# Patient Record
Sex: Female | Born: 1965 | Race: Black or African American | Hispanic: No | Marital: Married | Smoking: Never smoker
Health system: Southern US, Community
[De-identification: ages and names within clinical notes are randomized; demographics above are authoritative.]

## PROBLEM LIST (undated history)

## (undated) DIAGNOSIS — C50919 Malignant neoplasm of unspecified site of unspecified female breast: Secondary | ICD-10-CM

## (undated) DIAGNOSIS — I10 Essential (primary) hypertension: Secondary | ICD-10-CM

## (undated) HISTORY — PX: UTERINE FIBROID EMBOLIZATION: SHX825

## (undated) HISTORY — DX: Malignant neoplasm of unspecified site of unspecified female breast: C50.919

## (undated) HISTORY — PX: BREAST BIOPSY: SHX20

---

## 1983-10-02 HISTORY — PX: HAND TENDON SURGERY: SHX663

## 2014-12-14 ENCOUNTER — Encounter (HOSPITAL_COMMUNITY): Payer: Self-pay | Admitting: Emergency Medicine

## 2014-12-14 ENCOUNTER — Emergency Department (HOSPITAL_COMMUNITY)
Admission: EM | Admit: 2014-12-14 | Discharge: 2014-12-14 | Disposition: A | Discharge status: Home / Self Care | Payer: Self-pay | Source: Home / Self Care | Attending: Family Medicine | Admitting: Family Medicine

## 2014-12-14 DIAGNOSIS — R05 Cough: Secondary | ICD-10-CM

## 2014-12-14 DIAGNOSIS — R059 Cough, unspecified: Secondary | ICD-10-CM

## 2014-12-14 DIAGNOSIS — J069 Acute upper respiratory infection, unspecified: Secondary | ICD-10-CM

## 2014-12-14 DIAGNOSIS — R0982 Postnasal drip: Secondary | ICD-10-CM

## 2014-12-14 MED ORDER — IPRATROPIUM BROMIDE 0.06 % NA SOLN: 2.0000 | Freq: Four times a day (QID) | NASAL | Status: DC

## 2014-12-14 MED ORDER — GUAIFENESIN-CODEINE 100-10 MG/5ML PO SYRP: 5.0000 mL | ORAL_SOLUTION | ORAL | Status: DC | PRN

## 2014-12-14 NOTE — ED Notes (Signed)
C/o  Fatigue.  Body aches.   Chills.  Fever.  S/t.  Nonproductive cough.  Vomiting.  Chest cong.   Rattling in chest at night.   States "symptoms worse at night".   Mild relief with otc meds.

## 2014-12-14 NOTE — Discharge Instructions (Signed)
Upper Respiratory Infection, Adult Use the prescription Cheratussin for cough For drainage recommend Chlor-Trimeton (chlorpheniramine) 2 mg every 4-6 hours    Use lots of nasal saline spray frequently. Use the ipratropium nasal spray to help with nasal drainage. Sudafed PE 10 mg every 4 hours for head congestion. Drink lots of fluids and stay well-hydrated  An upper respiratory infection (URI) is also sometimes known as the common cold. The upper respiratory tract includes the nose, sinuses, throat, trachea, and bronchi. Bronchi are the airways leading to the lungs. Most people improve within 1 week, but symptoms can last up to 2 weeks. A residual cough may last even longer.  CAUSES Many different viruses can infect the tissues lining the upper respiratory tract. The tissues become irritated and inflamed and often become very moist. Mucus production is also common. A cold is contagious. You can easily spread the virus to others by oral contact. This includes kissing, sharing a glass, coughing, or sneezing. Touching your mouth or nose and then touching a surface, which is then touched by another person, can also spread the virus. SYMPTOMS  Symptoms typically develop 1 to 3 days after you come in contact with a cold virus. Symptoms vary from person to person. They may include:  Runny nose.  Sneezing.  Nasal congestion.  Sinus irritation.  Sore throat.  Loss of voice (laryngitis).  Cough.  Fatigue.  Muscle aches.  Loss of appetite.  Headache.  Low-grade fever. DIAGNOSIS  You might diagnose your own cold based on familiar symptoms, since most people get a cold 2 to 3 times a year. Your caregiver can confirm this based on your exam. Most importantly, your caregiver can check that your symptoms are not due to another disease such as strep throat, sinusitis, pneumonia, asthma, or epiglottitis. Blood tests, throat tests, and X-rays are not necessary to diagnose a common cold, but they  may sometimes be helpful in excluding other more serious diseases. Your caregiver will decide if any further tests are required. RISKS AND COMPLICATIONS  You may be at risk for a more severe case of the common cold if you smoke cigarettes, have chronic heart disease (such as heart failure) or lung disease (such as asthma), or if you have a weakened immune system. The very young and very old are also at risk for more serious infections. Bacterial sinusitis, middle ear infections, and bacterial pneumonia can complicate the common cold. The common cold can worsen asthma and chronic obstructive pulmonary disease (COPD). Sometimes, these complications can require emergency medical care and may be life-threatening. PREVENTION  The best way to protect against getting a cold is to practice good hygiene. Avoid oral or hand contact with people with cold symptoms. Wash your hands often if contact occurs. There is no clear evidence that vitamin C, vitamin E, echinacea, or exercise reduces the chance of developing a cold. However, it is always recommended to get plenty of rest and practice good nutrition. TREATMENT  Treatment is directed at relieving symptoms. There is no cure. Antibiotics are not effective, because the infection is caused by a virus, not by bacteria. Treatment may include:  Increased fluid intake. Sports drinks offer valuable electrolytes, sugars, and fluids.  Breathing heated mist or steam (vaporizer or shower).  Eating chicken soup or other clear broths, and maintaining good nutrition.  Getting plenty of rest.  Using gargles or lozenges for comfort.  Controlling fevers with ibuprofen or acetaminophen as directed by your caregiver.  Increasing usage of your inhaler if  you have asthma. Zinc gel and zinc lozenges, taken in the first 24 hours of the common cold, can shorten the duration and lessen the severity of symptoms. Pain medicines may help with fever, muscle aches, and throat pain. A  variety of non-prescription medicines are available to treat congestion and runny nose. Your caregiver can make recommendations and may suggest nasal or lung inhalers for other symptoms.  HOME CARE INSTRUCTIONS   Only take over-the-counter or prescription medicines for pain, discomfort, or fever as directed by your caregiver.  Use a warm mist humidifier or inhale steam from a shower to increase air moisture. This may keep secretions moist and make it easier to breathe.  Drink enough water and fluids to keep your urine clear or pale yellow.  Rest as needed.  Return to work when your temperature has returned to normal or as your caregiver advises. You may need to stay home longer to avoid infecting others. You can also use a face mask and careful hand washing to prevent spread of the virus. SEEK MEDICAL CARE IF:   After the first few days, you feel you are getting worse rather than better.  You need your caregiver's advice about medicines to control symptoms.  You develop chills, worsening shortness of breath, or brown or red sputum. These may be signs of pneumonia.  You develop yellow or brown nasal discharge or pain in the face, especially when you bend forward. These may be signs of sinusitis.  You develop a fever, swollen neck glands, pain with swallowing, or white areas in the back of your throat. These may be signs of strep throat. SEEK IMMEDIATE MEDICAL CARE IF:   You have a fever.  You develop severe or persistent headache, ear pain, sinus pain, or chest pain.  You develop wheezing, a prolonged cough, cough up blood, or have a change in your usual mucus (if you have chronic lung disease).  You develop sore muscles or a stiff neck. Document Released: 03/13/2001 Document Revised: 12/10/2011 Document Reviewed: 12/23/2013 [facility] Patient Information 2015 [place], [place]. This information is not intended to replace advice given to you by your health care provider. Make sure you  discuss any questions you have with your health care provider.  Cough, Adult  A cough is a reflex. It helps you clear your throat and airways. A cough can help heal your body. A cough can last 2 or 3 weeks (acute) or may last more than 8 weeks (chronic). Some common causes of a cough can include an infection, allergy, or a cold. HOME CARE  Only take medicine as told by your doctor.  If given, take your medicines (antibiotics) as told. Finish them even if you start to feel better.  Use a cold steam vaporizer or humidifier in your home. This can help loosen thick spit (secretions).  Sleep so you are almost sitting up (semi-upright). Use pillows to do this. This helps reduce coughing.  Rest as needed.  Stop smoking if you smoke. GET HELP RIGHT AWAY IF:  You have yellowish-white fluid (pus) in your thick spit.  Your cough gets worse.  Your medicine does not reduce coughing, and you are losing sleep.  You cough up blood.  You have trouble breathing.  Your pain gets worse and medicine does not help.  You have a fever. MAKE SURE YOU:   Understand these instructions.  Will watch your condition.  Will get help right away if you are not doing well or get worse. Document Released:  05/31/2011 Document Revised: 02/01/2014 Document Reviewed: 05/31/2011 [facility] Patient Information 2015 [place], [place]. This information is not intended to replace advice given to you by your health care provider. Make sure you discuss any questions you have with your health care provider.

## 2014-12-14 NOTE — ED Provider Notes (Signed)
CSN: [id number]     Arrival date & time 12/14/14  1150 History   First MD Initiated Contact with Patient 12/14/14 1422     Chief Complaint  Patient presents with  . Influenza   (Consider location/radiation/quality/duration/timing/severity/associated sxs/prior Treatment) HPI Comments: 49 year old female is complaining of a one-week history of chills, body aches, vomiting twice for 2-3 days earlier but not now, upper respiratory congestion, runny nose, PND, cough, sore throat and occasional dizziness. She has taken various OTC medications and states they have not stopped her symptoms.   History reviewed. No pertinent past medical history. History reviewed. No pertinent past surgical history. History reviewed. No pertinent family history. History  Substance Use Topics  . Smoking status: Never Smoker   . Smokeless tobacco: Not on file  . Alcohol Use: No   OB History    No data available     Review of Systems  Constitutional: Positive for chills, activity change and fatigue. Negative for fever and appetite change.  HENT: Positive for congestion, postnasal drip, rhinorrhea and sore throat. Negative for facial swelling.   Eyes: Negative.   Respiratory: Positive for cough.   Cardiovascular: Negative.   Gastrointestinal:       As per history of present illness  Genitourinary: Negative.   Musculoskeletal: Negative for neck pain and neck stiffness.  Skin: Negative for pallor and rash.  Neurological: Negative.     Allergies  Review of patient's allergies indicates no known allergies.  Home Medications   Prior to Admission medications   Medication Sig Start Date End Date Taking? Authorizing Provider  guaiFENesin-codeine (CHERATUSSIN AC) 100-10 MG/5ML syrup Take 5 mLs by mouth every 4 (four) hours as needed for cough or congestion. 12/14/14   Janne Napoleon, NP  ipratropium (ATROVENT) 0.06 % nasal spray Place 2 sprays into both nostrils 4 (four) times daily. 12/14/14   Janne Napoleon, NP   BP  185/100 mmHg  Pulse 71  Temp(Src) 99.2 F (37.3 C) (Oral)  Resp 18  SpO2 97%  LMP 11/30/2014 Physical Exam  Constitutional: She is oriented to person, place, and time. She appears well-developed and well-nourished. No distress.  HENT:  Right TM occluded with cerumen Left TM trans-lucent and retracted.  Eyes: Conjunctivae and EOM are normal.  Neck: Normal range of motion. Neck supple.  Cardiovascular: Normal rate, regular rhythm, normal heart sounds and intact distal pulses.   Pulmonary/Chest: Effort normal and breath sounds normal. No respiratory distress. She has no wheezes. She has no rales.  Lymphadenopathy:    She has no cervical adenopathy.  Neurological: She is alert and oriented to person, place, and time. She exhibits normal muscle tone.  Skin: Skin is warm and dry. No rash noted.  Psychiatric: She has a normal mood and affect.  Nursing note and vitals reviewed.   ED Course  Procedures (including critical care time) Labs Review Labs Reviewed - No data to display  Imaging Review No results found.   MDM   1. URI (upper respiratory infection)   2. PND (post-nasal drip)   3. Cough    Use the prescription Cheratussin for cough For drainage recommend Chlor-Trimeton (chlorpheniramine) 2 mg every 4-6 hours    Use lots of nasal saline spray frequently. Use the ipratropium nasal spray to help with nasal drainage. Sudafed PE 10 mg every 4 hours for head congestion. Drink lots of fluids and stay well-hydrated     Janne Napoleon, NP 12/14/14 1452

## 2018-03-05 ENCOUNTER — Encounter: Payer: Self-pay | Admitting: Obstetrics and Gynecology

## 2018-03-05 ENCOUNTER — Ambulatory Visit (INDEPENDENT_AMBULATORY_CARE_PROVIDER_SITE_OTHER): Payer: BLUE CROSS/BLUE SHIELD | Admitting: Obstetrics and Gynecology

## 2018-03-05 VITALS — BP 182/118 | HR 75 | Ht 67.0 in | Wt 176.5 lb

## 2018-03-05 DIAGNOSIS — Z13220 Encounter for screening for lipoid disorders: Secondary | ICD-10-CM

## 2018-03-05 DIAGNOSIS — Z30011 Encounter for initial prescription of contraceptive pills: Secondary | ICD-10-CM | POA: Diagnosis not present

## 2018-03-05 DIAGNOSIS — Z1231 Encounter for screening mammogram for malignant neoplasm of breast: Secondary | ICD-10-CM

## 2018-03-05 DIAGNOSIS — Z01419 Encounter for gynecological examination (general) (routine) without abnormal findings: Secondary | ICD-10-CM | POA: Diagnosis not present

## 2018-03-05 DIAGNOSIS — Z1211 Encounter for screening for malignant neoplasm of colon: Secondary | ICD-10-CM

## 2018-03-05 DIAGNOSIS — E663 Overweight: Secondary | ICD-10-CM | POA: Diagnosis not present

## 2018-03-05 DIAGNOSIS — Z0000 Encounter for general adult medical examination without abnormal findings: Secondary | ICD-10-CM

## 2018-03-05 DIAGNOSIS — N951 Menopausal and female climacteric states: Secondary | ICD-10-CM

## 2018-03-05 DIAGNOSIS — Z1239 Encounter for other screening for malignant neoplasm of breast: Secondary | ICD-10-CM

## 2018-03-05 LAB — POCT URINE PREGNANCY: Preg Test, Ur: NEGATIVE

## 2018-03-05 MED ORDER — NORETHINDRONE 0.35 MG PO TABS: 1.0000 | ORAL_TABLET | Freq: Every day | ORAL | 11 refills | Status: DC

## 2018-03-05 NOTE — Progress Notes (Signed)
ANNUAL PREVENTATIVE CARE GYNECOLOGY  ENCOUNTER NOTE  Subjective:       Jessica Moore is a 52 y.o. [phone] female here to establish care, and for a routine annual gynecologic exam. She was last seen by a GYN in 2016. The patient is sexually active.  The patient wears seatbelts: yes. The patient participates in regular exercise: not asked. Has the patient ever been transfused or tattooed?: no. The patient reports that there is not domestic violence in her life.  Current complaints: 1.  Irregular menstrual cycles.  Patient reports that her cycle is now occurring every few months, with normal flow.  She has a remote history of UFE for fibroid uterus.  She has no major complaints, but just wants to make sure that this is normal as she knows she is close to menopausal age.  2. Patient reports hot flushes (mostly at night), problems sleeping, and irritability.     Gynecologic History No LMP recorded. (Menstrual status: Irregular Periods). Contraception: none. Notes that she used to be on combined OCPs up until several years ago. Desires to resume some form of contraception.  Last Pap: 2016.  Results were: normal Last mammogram: 2016. Results were: normal Last Colonoscopy: never had one    Obstetric History OB History  Gravida Para Term Preterm AB Living  3 3 2 1   3   SAB TAB Ectopic Multiple Live Births          3    # Outcome Date GA Lbr Len/2nd Weight Sex Delivery Anes PTL Lv  3 Term 35    M Vag-Spont   LIV  2 Preterm 1984    F Vag-Spont   LIV  1 Term 38    F Vag-Spont   LIV    History reviewed. No pertinent past medical history.  Family History  Problem Relation Age of Onset  . Diabetes Mother     Past Surgical History:  Procedure Laterality Date  . HAND TENDON SURGERY    . UTERINE FIBROID EMBOLIZATION      Social History   Socioeconomic History  . Marital status: Married    Spouse name: Not on file  . Number of children: Not on file  . Years of  education: Not on file  . Highest education level: Not on file  Occupational History  . Not on file  Social Needs  . Financial resource strain: Not on file  . Food insecurity:    Worry: Not on file    Inability: Not on file  . Transportation needs:    Medical: Not on file    Non-medical: Not on file  Tobacco Use  . Smoking status: Never Smoker  . Smokeless tobacco: Never Used  Substance and Sexual Activity  . Alcohol use: Yes    Comment: occass  . Drug use: No  . Sexual activity: Yes    Birth control/protection: None  Lifestyle  . Physical activity:    Days per week: Not on file    Minutes per session: Not on file  . Stress: Not on file  Relationships  . Social connections:    Talks on phone: Not on file    Gets together: Not on file    Attends religious service: Not on file    Active member of club or organization: Not on file    Attends meetings of clubs or organizations: Not on file    Relationship status: Not on file  . Intimate partner violence:  Fear of current or ex partner: Not on file    Emotionally abused: Not on file    Physically abused: Not on file    Forced sexual activity: Not on file  Other Topics Concern  . Not on file  Social History Narrative  . Not on file    Current Outpatient Medications on File Prior to Visit  Medication Sig Dispense Refill  . Multiple Vitamin (MULTI VITAMIN PO) Take by mouth.     No current facility-administered medications on file prior to visit.     No Known Allergies    Review of Systems ROS Review of Systems - General ROS: negative for - chills, fatigue, fever, night sweats, weight gain or weight loss. Positive for hot flashes Psychological ROS: negative for - anxiety, decreased libido, depression, mood swings, physical abuse or sexual abuse. Positive for irritability  Ophthalmic ROS: negative for - blurry vision, eye pain or loss of vision ENT ROS: negative for - headaches, hearing change, visual changes or  vocal changes Allergy and Immunology ROS: negative for - hives, itchy/watery eyes or seasonal allergies Hematological and Lymphatic ROS: negative for - bleeding problems, bruising, swollen lymph nodes or weight loss Endocrine ROS: negative for - galactorrhea, hair pattern changes, hot flashes, malaise/lethargy, mood swings, palpitations, polydipsia/polyuria, skin changes, temperature intolerance or unexpected weight changes Breast ROS: negative for - new or changing breast lumps or nipple discharge Respiratory ROS: negative for - cough or shortness of breath Cardiovascular ROS: negative for - chest pain, irregular heartbeat, palpitations or shortness of breath Gastrointestinal ROS: no abdominal pain, change in bowel habits, or black or bloody stools Genito-Urinary ROS: no dysuria, trouble voiding, or hematuria. Positive for irregular cycles.  Musculoskeletal ROS: negative for - joint pain or joint stiffness Neurological ROS: negative for - bowel and bladder control changes Dermatological ROS: negative for rash and skin lesion changes   Objective:   BP (!) 182/118   Pulse 75   Ht 5\' 7"  (1.702 m)   Wt 176 lb 8 oz (80.1 kg)   BMI 27.64 kg/m  CONSTITUTIONAL: Well-developed, well-nourished female in no acute distress. Overweight PSYCHIATRIC: Normal mood and affect. Normal behavior. Normal judgment and thought content. [place]: Alert and oriented to person, place, and time. Normal muscle tone coordination. No cranial nerve deficit noted. HENT:  Normocephalic, atraumatic, External right and left ear normal. Oropharynx is clear and moist EYES: Conjunctivae and EOM are normal. Pupils are equal, round, and reactive to light. No scleral icterus.  NECK: Normal range of motion, supple, no masses.  Normal thyroid.  SKIN: Skin is warm and dry. No rash noted. Not diaphoretic. No erythema. No pallor. CARDIOVASCULAR: Normal heart rate noted, regular rhythm, no murmur. RESPIRATORY: Clear to auscultation  bilaterally. Effort and breath sounds normal, no problems with respiration noted. BREASTS: Symmetric in size. No masses, skin changes, nipple drainage, or lymphadenopathy. ABDOMEN: Soft, normal bowel sounds, no distention noted.  No tenderness, rebound or guarding.  BLADDER: Normal PELVIC:  Bladder no bladder distension noted  Urethra: normal appearing urethra with no masses, tenderness or lesions  Vulva: normal appearing vulva with no masses, tenderness or lesions  Vagina: normal appearing vagina with normal color and discharge, no lesions  Cervix: normal appearing cervix without discharge or lesions  Uterus: uterus is normal size, shape, consistency and nontender  Adnexa: normal adnexa in size, nontender and no masses  RV: External Exam NormaI, No Rectal Masses and Normal Sphincter tone  MUSCULOSKELETAL: Normal range of motion. No tenderness.  No  cyanosis, clubbing, or edema.  2+ distal pulses. LYMPHATIC: No Axillary, Supraclavicular, or Inguinal Adenopathy.   Labs: No results found for: WBC, HGB, HCT, MCV, PLT  No results found for: CREATININE, BUN, NA, K, CL, CO2  No results found for: ALT, AST, GGT, ALKPHOS, BILITOT  No results found for: CHOL, HDL, LDLCALC, LDLDIRECT, TRIG, CHOLHDL  No results found for: TSH  No results found for: HGBA1C   Assessment:   Annual gynecologic examination Encounter to establish care Overweight Contraception management Perimenopausal symptoms  Plan:  Pap: Pap Co Test performed today Mammogram: Ordered Stool Guaiac Testing:  Not Ordered. Will refer for Colonoscopy Labs: Lipid panel, FBS, TSH and Vit D Level"", CBC, and CMP Routine preventative health maintenance measures emphasized: Exercise/Diet/Weight control, Alcohol/Substance use risks, Stress Management and Safe Sex  Contraception: none currently.  Patient has been using OCPs in the past. Is concerned about her risk if she were to continue use of OCPs. Discussed other options that  could help not only help for contraception, but could also help with some of her likely perimenopausal symptoms and irregular cycles, including: progesterone OCP, Mirena IUD, Depo Provera. Also discussed options for permanent sterilization, including tubal ligation and vasectomy. Patient desires to try progesterone OCP. Will prescribe Camilla. UPT negative today.  Patient desires referral to re-establish with a PCP. Will place referral.  Return to [place], MD [facility]

## 2018-03-05 NOTE — Patient Instructions (Signed)
Health Maintenance, Female Adopting a healthy lifestyle and getting preventive care can go a long way to promote health and wellness. Talk with your health care provider about what schedule of regular examinations is right for you. This is a good chance for you to check in with your provider about disease prevention and staying healthy. In between checkups, there are plenty of things you can do on your own. Experts have done a lot of research about which lifestyle changes and preventive measures are most likely to keep you healthy. Ask your health care provider for more information. Weight and diet Eat a healthy diet  Be sure to include plenty of vegetables, fruits, low-fat dairy products, and lean protein.  Do not eat a lot of foods high in solid fats, added sugars, or salt.  Get regular exercise. This is one of the most important things you can do for your health. ? Most adults should exercise for at least 150 minutes each week. The exercise should increase your heart rate and make you sweat (moderate-intensity exercise). ? Most adults should also do strengthening exercises at least twice a week. This is in addition to the moderate-intensity exercise.  Maintain a healthy weight  Body mass index (BMI) is a measurement that can be used to identify possible weight problems. It estimates body fat based on height and weight. Your health care provider can help determine your BMI and help you achieve or maintain a healthy weight.  For females 20 years of age and older: ? A BMI below 18.5 is considered underweight. ? A BMI of 18.5 to 24.9 is normal. ? A BMI of 25 to 29.9 is considered overweight. ? A BMI of 30 and above is considered obese.  Watch levels of cholesterol and blood lipids  You should start having your blood tested for lipids and cholesterol at 54 years of age, then have this test every 5 years.  You may need to have your cholesterol levels checked more often if: ? Your lipid or  cholesterol levels are high. ? You are older than 54 years of age. ? You are at high risk for heart disease.  Cancer screening Lung Cancer  Lung cancer screening is recommended for adults 55-80 years old who are at high risk for lung cancer because of a history of smoking.  A yearly low-dose CT scan of the lungs is recommended for people who: ? Currently smoke. ? Have quit within the past 15 years. ? Have at least a 30-pack-year history of smoking. A pack year is smoking an average of one pack of cigarettes a day for 1 year.  Yearly screening should continue until it has been 15 years since you quit.  Yearly screening should stop if you develop a health problem that would prevent you from having lung cancer treatment.  Breast Cancer  Practice breast self-awareness. This means understanding how your breasts normally appear and feel.  It also means doing regular breast self-exams. Let your health care provider know about any changes, no matter how small.  If you are in your 20s or 30s, you should have a clinical breast exam (CBE) by a health care provider every 1-3 years as part of a regular health exam.  If you are 40 or older, have a CBE every year. Also consider having a breast X-ray (mammogram) every year.  If you have a family history of breast cancer, talk to your health care provider about genetic screening.  If you are at high risk   for breast cancer, talk to your health care provider about having an MRI and a mammogram every year.  Breast cancer gene (BRCA) assessment is recommended for women who have family members with BRCA-related cancers. BRCA-related cancers include: ? Breast. ? Ovarian. ? Tubal. ? Peritoneal cancers.  Results of the assessment will determine the need for genetic counseling and BRCA1 and BRCA2 testing.  Cervical Cancer Your health care provider may recommend that you be screened regularly for cancer of the pelvic organs (ovaries, uterus, and  vagina). This screening involves a pelvic examination, including checking for microscopic changes to the surface of your cervix (Pap test). You may be encouraged to have this screening done every 3 years, beginning at age 22.  For women ages 56-65, health care providers may recommend pelvic exams and Pap testing every 3 years, or they may recommend the Pap and pelvic exam, combined with testing for human papilloma virus (HPV), every 5 years. Some types of HPV increase your risk of cervical cancer. Testing for HPV may also be done on women of any age with unclear Pap test results.  Other health care providers may not recommend any screening for nonpregnant women who are considered low risk for pelvic cancer and who do not have symptoms. Ask your health care provider if a screening pelvic exam is right for you.  If you have had past treatment for cervical cancer or a condition that could lead to cancer, you need Pap tests and screening for cancer for at least 20 years after your treatment. If Pap tests have been discontinued, your risk factors (such as having a new sexual partner) need to be reassessed to determine if screening should resume. Some women have medical problems that increase the chance of getting cervical cancer. In these cases, your health care provider may recommend more frequent screening and Pap tests.  Colorectal Cancer  This type of cancer can be detected and often prevented.  Routine colorectal cancer screening usually begins at 54 years of age and continues through 54 years of age.  Your health care provider may recommend screening at an earlier age if you have risk factors for colon cancer.  Your health care provider may also recommend using home test kits to check for hidden blood in the stool.  A small camera at the end of a tube can be used to examine your colon directly (sigmoidoscopy or colonoscopy). This is done to check for the earliest forms of colorectal  cancer.  Routine screening usually begins at age 33.  Direct examination of the colon should be repeated every 5-10 years through 54 years of age. However, you may need to be screened more often if early forms of precancerous polyps or small growths are found.  Skin Cancer  Check your skin from head to toe regularly.  Tell your health care provider about any new moles or changes in moles, especially if there is a change in a mole's shape or color.  Also tell your health care provider if you have a mole that is larger than the size of a pencil eraser.  Always use sunscreen. Apply sunscreen liberally and repeatedly throughout the day.  Protect yourself by wearing long sleeves, pants, a wide-brimmed hat, and sunglasses whenever you are outside.  Heart disease, diabetes, and high blood pressure  High blood pressure causes heart disease and increases the risk of stroke. High blood pressure is more likely to develop in: ? People who have blood pressure in the high end of  the normal range (130-139/85-89 mm Hg). ? People who are overweight or obese. ? People who are African American.  If you are 21-29 years of age, have your blood pressure checked every 3-5 years. If you are 3 years of age or older, have your blood pressure checked every year. You should have your blood pressure measured twice-once when you are at a hospital or clinic, and once when you are not at a hospital or clinic. Record the average of the two measurements. To check your blood pressure when you are not at a hospital or clinic, you can use: ? An automated blood pressure machine at a pharmacy. ? A home blood pressure monitor.  If you are between 17 years and 37 years old, ask your health care provider if you should take aspirin to prevent strokes.  Have regular diabetes screenings. This involves taking a blood sample to check your fasting blood sugar level. ? If you are at a normal weight and have a low risk for diabetes,  have this test once every three years after 54 years of age. ? If you are overweight and have a high risk for diabetes, consider being tested at a younger age or more often. Preventing infection Hepatitis B  If you have a higher risk for hepatitis B, you should be screened for this virus. You are considered at high risk for hepatitis B if: ? You were born in a country where hepatitis B is common. Ask your health care provider which countries are considered high risk. ? Your parents were born in a high-risk country, and you have not been immunized against hepatitis B (hepatitis B vaccine). ? You have HIV or AIDS. ? You use needles to inject street drugs. ? You live with someone who has hepatitis B. ? You have had sex with someone who has hepatitis B. ? You get hemodialysis treatment. ? You take certain medicines for conditions, including cancer, organ transplantation, and autoimmune conditions.  Hepatitis C  Blood testing is recommended for: ? Everyone born from [birth year] through 1967. ? Anyone with known risk factors for hepatitis C.  Sexually transmitted infections (STIs)  You should be screened for sexually transmitted infections (STIs) including gonorrhea and chlamydia if: ? You are sexually active and are younger than 54 years of age. ? You are older than 54 years of age and your health care provider tells you that you are at risk for this type of infection. ? Your sexual activity has changed since you were last screened and you are at an increased risk for chlamydia or gonorrhea. Ask your health care provider if you are at risk.  If you do not have HIV, but are at risk, it may be recommended that you take a prescription medicine daily to prevent HIV infection. This is called pre-exposure prophylaxis (PrEP). You are considered at risk if: ? You are sexually active and do not regularly use condoms or know the HIV status of your partner(s). ? You take drugs by injection. ? You are  sexually active with a partner who has HIV.  Talk with your health care provider about whether you are at high risk of being infected with HIV. If you choose to begin PrEP, you should first be tested for HIV. You should then be tested every 3 months for as long as you are taking PrEP. Pregnancy  If you are premenopausal and you may become pregnant, ask your health care provider about preconception counseling.  If you may become  pregnant, take 400 to 800 micrograms (mcg) of folic acid every day.  If you want to prevent pregnancy, talk to your health care provider about birth control (contraception). Osteoporosis and menopause  Osteoporosis is a disease in which the bones lose minerals and strength with aging. This can result in serious bone fractures. Your risk for osteoporosis can be identified using a bone density scan.  If you are 62 years of age or older, or if you are at risk for osteoporosis and fractures, ask your health care provider if you should be screened.  Ask your health care provider whether you should take a calcium or vitamin D supplement to lower your risk for osteoporosis.  Menopause may have certain physical symptoms and risks.  Hormone replacement therapy may reduce some of these symptoms and risks. Talk to your health care provider about whether hormone replacement therapy is right for you. Follow these instructions at home:  Schedule regular health, dental, and eye exams.  Stay current with your immunizations.  Do not use any tobacco products including cigarettes, chewing tobacco, or electronic cigarettes.  If you are pregnant, do not drink alcohol.  If you are breastfeeding, limit how much and how often you drink alcohol.  Limit alcohol intake to no more than 1 drink per day for nonpregnant women. One drink equals 12 ounces of beer, 5 ounces of wine, or 1 ounces of hard liquor.  Do not use street drugs.  Do not share needles.  Ask your health care  provider for help if you need support or information about quitting drugs.  Tell your health care provider if you often feel depressed.  Tell your health care provider if you have ever been abused or do not feel safe at home. This information is not intended to replace advice given to you by your health care provider. Make sure you discuss any questions you have with your health care provider. Document Released: 04/02/2011 Document Revised: 02/23/2016 Document Reviewed: 06/21/2015 Elsevier Interactive Patient Education  2018 [place] is the time when your body begins to move into the menopause (no menstrual period for 12 straight months). It is a natural process. Perimenopause can begin 2-8 years before the menopause and usually lasts for 1 year after the menopause. During this time, your ovaries may or may not produce an egg. The ovaries vary in their production of estrogen and progesterone hormones each month. This can cause irregular menstrual periods, difficulty getting pregnant, vaginal bleeding between periods, and uncomfortable symptoms. What are the causes?  Irregular production of the ovarian hormones, estrogen and progesterone, and not ovulating every month. Other causes include:  Tumor of the pituitary gland in the brain.  Medical disease that affects the ovaries.  Radiation treatment.  Chemotherapy.  Unknown causes.  Heavy smoking and excessive alcohol intake can bring on perimenopause sooner.  What are the signs or symptoms?  Hot flashes.  Night sweats.  Irregular menstrual periods.  Decreased sex drive.  Vaginal dryness.  Headaches.  Mood swings.  Depression.  Memory problems.  Irritability.  Tiredness.  Weight gain.  Trouble getting pregnant.  The beginning of losing bone cells (osteoporosis).  The beginning of hardening of the arteries (atherosclerosis). How is this diagnosed? Your health care provider  will make a diagnosis by analyzing your age, menstrual history, and symptoms. He or she will do a physical exam and note any changes in your body, especially your female organs. Female hormone tests may or may  not be helpful depending on the amount of female hormones you produce and when you produce them. However, other hormone tests may be helpful to rule out other problems. How is this treated? In some cases, no treatment is needed. The decision on whether treatment is necessary during the perimenopause should be made by you and your health care provider based on how the symptoms are affecting you and your lifestyle. Various treatments are available, such as:  Treating individual symptoms with a specific medicine for that symptom.  Herbal medicines that can help specific symptoms.  Counseling.  Group therapy.  Follow these instructions at home:  Keep track of your menstrual periods (when they occur, how heavy they are, how long between periods, and how long they last) as well as your symptoms and when they started.  Only take over-the-counter or prescription medicines as directed by your health care provider.  Sleep and rest.  Exercise.  Eat a diet that contains calcium (good for your bones) and soy (acts like the estrogen hormone).  Do not smoke.  Avoid alcoholic beverages.  Take vitamin supplements as recommended by your health care provider. Taking vitamin E may help in certain cases.  Take calcium and vitamin D supplements to help prevent bone loss.  Group therapy is sometimes helpful.  Acupuncture may help in some cases. Contact a health care provider if:  You have questions about any symptoms you are having.  You need a referral to a specialist (gynecologist, psychiatrist, or psychologist). Get help right away if:  You have vaginal bleeding.  Your period lasts longer than 8 days.  Your periods are recurring sooner than 21 days.  You have bleeding after  intercourse.  You have severe depression.  You have pain when you urinate.  You have severe headaches.  You have vision problems. This information is not intended to replace advice given to you by your health care provider. Make sure you discuss any questions you have with your health care provider. Document Released: 10/25/2004 Document Revised: 02/23/2016 Document Reviewed: 04/16/2013 Elsevier Interactive Patient Education  2017 [employer].

## 2018-03-05 NOTE — Progress Notes (Signed)
Pt stated that she is having irregular cycles.

## 2018-03-06 LAB — LIPID PANEL
Chol/HDL Ratio: 3 ratio (ref 0.0–4.4)
Cholesterol, Total: 205 mg/dL — ABNORMAL HIGH (ref 100–199)
HDL: 69 mg/dL (ref >39)
LDL Calculated: 113 mg/dL — ABNORMAL HIGH (ref 0–99)
TRIGLYCERIDES: 115 mg/dL (ref 0–149)
VLDL CHOLESTEROL CAL: 23 mg/dL (ref 5–40)

## 2018-03-06 LAB — COMPREHENSIVE METABOLIC PANEL
ALBUMIN: 4.5 g/dL (ref 3.5–5.5)
ALT: 16 IU/L (ref 0–32)
AST: 18 IU/L (ref 0–40)
Albumin/Globulin Ratio: 1.6 (ref 1.2–2.2)
Alkaline Phosphatase: 95 IU/L (ref 39–117)
BILIRUBIN TOTAL: 0.3 mg/dL (ref 0.0–1.2)
BUN / CREAT RATIO: 14 (ref 9–23)
BUN: 12 mg/dL (ref 6–24)
CHLORIDE: 103 mmol/L (ref 96–106)
CO2: 19 mmol/L — AB (ref 20–29)
CREATININE: 0.85 mg/dL (ref 0.57–1.00)
Calcium: 9.4 mg/dL (ref 8.7–10.2)
GFR calc Af Amer: 90 mL/min/{1.73_m2} (ref >59)
GFR calc non Af Amer: 78 mL/min/{1.73_m2} (ref >59)
Globulin, Total: 2.8 g/dL (ref 1.5–4.5)
Glucose: 85 mg/dL (ref 65–99)
Potassium: 4 mmol/L (ref 3.5–5.2)
Sodium: 140 mmol/L (ref 134–144)
Total Protein: 7.3 g/dL (ref 6.0–8.5)

## 2018-03-06 LAB — CBC
HEMOGLOBIN: 13.2 g/dL (ref 11.1–15.9)
Hematocrit: 39.9 % (ref 34.0–46.6)
MCH: 27.8 pg (ref 26.6–33.0)
MCHC: 33.1 g/dL (ref 31.5–35.7)
MCV: 84 fL (ref 79–97)
Platelets: 252 10*3/uL (ref 150–450)
RBC: 4.74 x10E6/uL (ref 3.77–5.28)
RDW: 13.9 % (ref 12.3–15.4)
WBC: 8 10*3/uL (ref 3.4–10.8)

## 2018-03-06 LAB — TSH: TSH: 1.25 u[IU]/mL (ref 0.450–4.500)

## 2018-03-06 LAB — VITAMIN D 25 HYDROXY (VIT D DEFICIENCY, FRACTURES): VIT D 25 HYDROXY: 28.7 ng/mL — AB (ref 30.0–100.0)

## 2018-03-10 LAB — IGP, COBASHPV16/18
HPV 16: NEGATIVE
HPV 18: NEGATIVE
HPV OTHER HR TYPES: NEGATIVE
PAP Smear Comment: 0

## 2018-03-11 ENCOUNTER — Other Ambulatory Visit: Payer: Self-pay

## 2018-03-11 DIAGNOSIS — Z1211 Encounter for screening for malignant neoplasm of colon: Secondary | ICD-10-CM

## 2018-03-11 MED ORDER — NA SULFATE-K SULFATE-MG SULF 17.5-3.13-1.6 GM/177ML PO SOLN: 1.0000 | Freq: Once | ORAL | 0 refills | Status: AC

## 2018-03-12 ENCOUNTER — Other Ambulatory Visit: Payer: Self-pay

## 2018-03-12 DIAGNOSIS — Z1211 Encounter for screening for malignant neoplasm of colon: Secondary | ICD-10-CM

## 2018-03-12 MED ORDER — PEG 3350-KCL-NA BICARB-NACL 420 G PO SOLR: 4000.0000 mL | Freq: Once | ORAL | 0 refills | Status: AC

## 2018-03-13 ENCOUNTER — Other Ambulatory Visit: Payer: Self-pay

## 2018-03-13 DIAGNOSIS — Z1211 Encounter for screening for malignant neoplasm of colon: Secondary | ICD-10-CM

## 2018-03-13 MED ORDER — PEG 3350-KCL-NA BICARB-NACL 420 G PO SOLR: 4000.0000 mL | Freq: Once | ORAL | 0 refills | Status: AC

## 2018-03-14 ENCOUNTER — Telehealth: Payer: Self-pay | Admitting: Obstetrics and Gynecology

## 2018-03-14 ENCOUNTER — Other Ambulatory Visit: Payer: Self-pay

## 2018-03-14 NOTE — Telephone Encounter (Signed)
Error

## 2018-03-14 NOTE — Telephone Encounter (Signed)
The patient called and stated that she would like to speak with her nurse today if possible in regards to her not receiving a letter in the mail pertaining to her results and what she should take moving forward. The patient also stated she had other questions about a specific vitamins she is supposed to take, and a few addition questions as well. The patient is also experiencing some hot flashes as well. Please advise.

## 2018-03-18 MED ORDER — PAROXETINE HCL 10 MG PO TABS: 10.0000 mg | ORAL_TABLET | Freq: Every day | ORAL | 6 refills | Status: DC

## 2018-03-18 NOTE — Telephone Encounter (Signed)
Spoke with pt on yesterday please see result notes.

## 2018-03-18 NOTE — Telephone Encounter (Signed)
Spoke with pt and informed her that [facility] prescribed her some medication to take her hot flashes and we would look into why she haven't heard from anyone concerning her referral.

## 2018-03-18 NOTE — Addendum Note (Signed)
Addended by: Edwyna Shell on: 03/18/2018 04:08 PM   Modules accepted: Orders

## 2018-03-20 ENCOUNTER — Telehealth: Payer: Self-pay | Admitting: Obstetrics and Gynecology

## 2018-03-20 NOTE — Telephone Encounter (Signed)
The patient called and stated that she needs to speak to Dr. Andreas Blower nurse in regards to her having some questions in regards to her medication that was recently prescribed to her. Please advise.

## 2018-03-21 NOTE — Telephone Encounter (Signed)
Pt called and spoke with pt and informed her that the medication [facility] prescribed for her is also used to treat hot flashes along with depression. Pt stated that she called to ask to make sure she was prescribed the correct medication.

## 2018-03-25 ENCOUNTER — Ambulatory Visit: Payer: BLUE CROSS/BLUE SHIELD | Admitting: Certified Registered Nurse Anesthetist

## 2018-03-25 ENCOUNTER — Emergency Department
Admission: EM | Admit: 2018-03-25 | Discharge: 2018-03-25 | Disposition: A | Discharge status: Home / Self Care | Payer: BLUE CROSS/BLUE SHIELD | Source: Home / Self Care | Attending: Emergency Medicine | Admitting: Emergency Medicine

## 2018-03-25 ENCOUNTER — Encounter
Admission: RE | Disposition: A | Discharge status: Home / Self Care | Payer: Self-pay | Source: Ambulatory Visit | Attending: Gastroenterology

## 2018-03-25 ENCOUNTER — Other Ambulatory Visit: Payer: Self-pay

## 2018-03-25 ENCOUNTER — Encounter: Payer: Self-pay | Admitting: *Deleted

## 2018-03-25 ENCOUNTER — Ambulatory Visit
Admission: RE | Admit: 2018-03-25 | Discharge: 2018-03-25 | Disposition: A | Discharge status: Home / Self Care | Payer: BLUE CROSS/BLUE SHIELD | Source: Ambulatory Visit | Attending: Gastroenterology | Admitting: Gastroenterology

## 2018-03-25 ENCOUNTER — Encounter: Payer: Self-pay | Admitting: Emergency Medicine

## 2018-03-25 DIAGNOSIS — Z1211 Encounter for screening for malignant neoplasm of colon: Secondary | ICD-10-CM

## 2018-03-25 DIAGNOSIS — Z79899 Other long term (current) drug therapy: Secondary | ICD-10-CM | POA: Insufficient documentation

## 2018-03-25 DIAGNOSIS — I10 Essential (primary) hypertension: Secondary | ICD-10-CM

## 2018-03-25 DIAGNOSIS — D125 Benign neoplasm of sigmoid colon: Secondary | ICD-10-CM

## 2018-03-25 DIAGNOSIS — K635 Polyp of colon: Secondary | ICD-10-CM | POA: Diagnosis not present

## 2018-03-25 HISTORY — PX: COLONOSCOPY WITH PROPOFOL: SHX5780

## 2018-03-25 HISTORY — DX: Essential (primary) hypertension: I10

## 2018-03-25 SURGERY — COLONOSCOPY WITH PROPOFOL
Anesthesia: General

## 2018-03-25 MED ORDER — HYDRALAZINE HCL 20 MG/ML IJ SOLN
INTRAMUSCULAR | Status: DC | PRN
  Administered 2018-03-25 (×2): 10 mg via INTRAVENOUS

## 2018-03-25 MED ORDER — HYDRALAZINE HCL 20 MG/ML IJ SOLN
INTRAMUSCULAR | Status: AC
  Filled 2018-03-25: qty 1

## 2018-03-25 MED ORDER — LIDOCAINE HCL (PF) 1 % IJ SOLN
INTRAMUSCULAR | Status: AC
  Administered 2018-03-25: 0.3 mL via INTRADERMAL
  Filled 2018-03-25: qty 2

## 2018-03-25 MED ORDER — HYDROCHLOROTHIAZIDE 25 MG PO TABS: 25.0000 mg | ORAL_TABLET | Freq: Every day | ORAL | 0 refills | Status: DC

## 2018-03-25 MED ORDER — FENTANYL CITRATE (PF) 100 MCG/2ML IJ SOLN
INTRAMUSCULAR | Status: AC
  Filled 2018-03-25: qty 2

## 2018-03-25 MED ORDER — LIDOCAINE HCL (PF) 2 % IJ SOLN
INTRAMUSCULAR | Status: AC
  Filled 2018-03-25: qty 50

## 2018-03-25 MED ORDER — PHENYLEPHRINE HCL 10 MG/ML IJ SOLN
INTRAMUSCULAR | Status: DC | PRN
  Administered 2018-03-25 (×2): 0.1 mg via INTRAVENOUS

## 2018-03-25 MED ORDER — MIDAZOLAM HCL 2 MG/2ML IJ SOLN
INTRAMUSCULAR | Status: AC
  Filled 2018-03-25: qty 2

## 2018-03-25 MED ORDER — LIDOCAINE HCL (CARDIAC) PF 100 MG/5ML IV SOSY
PREFILLED_SYRINGE | INTRAVENOUS | Status: DC | PRN
  Administered 2018-03-25: 100 mg via INTRATRACHEAL

## 2018-03-25 MED ORDER — SODIUM CHLORIDE 0.9 % IV SOLN
INTRAVENOUS | Status: DC
  Administered 2018-03-25: 11:00:00 via INTRAVENOUS
  Administered 2018-03-25: 1000 mL via INTRAVENOUS

## 2018-03-25 MED ORDER — PROPOFOL 500 MG/50ML IV EMUL
INTRAVENOUS | Status: DC | PRN
  Administered 2018-03-25: 125 ug/kg/min via INTRAVENOUS

## 2018-03-25 MED ORDER — LIDOCAINE HCL (PF) 1 % IJ SOLN
2.0000 mL | Freq: Once | INTRAMUSCULAR | Status: AC
  Administered 2018-03-25: 0.3 mL via INTRADERMAL

## 2018-03-25 MED ORDER — CLONIDINE HCL 0.1 MG PO TABS
0.2000 mg | ORAL_TABLET | Freq: Once | ORAL | Status: AC
  Administered 2018-03-25: 0.2 mg via ORAL
  Filled 2018-03-25: qty 2

## 2018-03-25 MED ORDER — FENTANYL CITRATE (PF) 100 MCG/2ML IJ SOLN
INTRAMUSCULAR | Status: DC | PRN
  Administered 2018-03-25: 25 ug via INTRAVENOUS

## 2018-03-25 MED ORDER — MIDAZOLAM HCL 5 MG/5ML IJ SOLN
INTRAMUSCULAR | Status: DC | PRN
  Administered 2018-03-25: 2 mg via INTRAVENOUS

## 2018-03-25 MED ORDER — PROPOFOL 10 MG/ML IV BOLUS
INTRAVENOUS | Status: DC | PRN
  Administered 2018-03-25 (×2): 20 mg via INTRAVENOUS
  Administered 2018-03-25: 60 mg via INTRAVENOUS

## 2018-03-25 NOTE — Op Note (Signed)
[facility] Patient Name: Jessica Moore Procedure Date: 03/25/2018 10:11 AM MRN: [id number] Account #: [id number] Date of Birth: 1966-03-27 Admit Type: Outpatient Age: 52 Room: [facility] ENDO ROOM 1 Gender: Female Note Status: Finalized Procedure:            Colonoscopy Indications:          Screening for colorectal malignant neoplasm Providers:            Jonathon Bellows MD, MD Referring MD:         Chesley Noon. Marcelline Mates (Referring MD) Medicines:            Monitored Anesthesia Care Complications:        No immediate complications. Procedure:            Pre-Anesthesia Assessment:                       - Prior to the procedure, a History and Physical was                        performed, and patient medications, allergies and                        sensitivities were reviewed. The patient's tolerance of                        previous anesthesia was reviewed.                       - The risks and benefits of the procedure and the                        sedation options and risks were discussed with the                        patient. All questions were answered and informed                        consent was obtained.                       - ASA Grade Assessment: II - A patient with mild                        systemic disease.                       After obtaining informed consent, the colonoscope was                        passed under direct vision. Throughout the procedure,                        the patient's blood pressure, pulse, and oxygen                        saturations were monitored continuously. The                        Colonoscope was introduced through the anus and  advanced to the the cecum, identified by the                        appendiceal orifice, IC valve and transillumination.                        The Colonoscope was introduced through the and advanced                        to the. The colonoscopy was  performed with ease. The                        patient tolerated the procedure well. The quality of                        the bowel preparation was good. Findings:      The perianal and digital rectal examinations were normal.      A 3 mm polyp was found in the sigmoid colon. The polyp was sessile. The       polyp was removed with a cold biopsy forceps. Resection and retrieval       were complete.      The exam was otherwise without abnormality on direct and retroflexion       views. Impression:           - One 3 mm polyp in the sigmoid colon, removed with a                        cold biopsy forceps. Resected and retrieved.                       - The examination was otherwise normal on direct and                        retroflexion views. Recommendation:       - Discharge patient to home (with escort).                       - Resume previous diet.                       - Continue present medications.                       - Await pathology results.                       - Repeat colonoscopy in 5-10 years for surveillance.                       - Suggest going to the [facility] clinic after her                        procedure to have her high blood pressure addressed.                        she is not on any meds. Procedure Code(s):    --- Professional ---                       [phone], Colonoscopy, flexible; with biopsy, single or  multiple Diagnosis Code(s):    --- Professional ---                       Z12.11, Encounter for screening for malignant neoplasm                        of colon                       D12.5, Benign neoplasm of sigmoid colon CPT copyright 2017 American Medical Association. All rights reserved. The codes documented in this report are preliminary and upon coder review may  be revised to meet current compliance requirements. Jonathon Bellows, MD Jonathon Bellows MD, MD 03/25/2018 11:26:53 AM This report has been signed electronically. Number of  Addenda: 0 Note Initiated On: 03/25/2018 10:11 AM Scope Withdrawal Time: 0 hours 22 minutes 43 seconds  Total Procedure Duration: 0 hours 27 minutes 45 seconds       [facility]

## 2018-03-25 NOTE — ED Triage Notes (Signed)
Pt presents to ED from [facility] for hypertension. Pt states she had a colonoscopy done at [facility] today and was given 20mg  IV hydralazine prior to procedure for BP 200s/110s that reduced SBP to 180s. However, pt was told by her GI doc after procedure to "come to urgent care to establish care for BP management." Pt asymptomatic at this time.

## 2018-03-25 NOTE — Anesthesia Postprocedure Evaluation (Signed)
Anesthesia Post Note  Patient: Jessica Moore  Procedure(s) Performed: COLONOSCOPY WITH PROPOFOL (N/A )  Patient location during evaluation: Endoscopy Anesthesia Type: General Level of consciousness: awake and alert Pain management: pain level controlled Vital Signs Assessment: post-procedure vital signs reviewed and stable Respiratory status: spontaneous breathing, nonlabored ventilation, respiratory function stable and patient connected to nasal cannula oxygen Cardiovascular status: blood pressure returned to baseline and stable Postop Assessment: no apparent nausea or vomiting Anesthetic complications: no Comments: Patient was very hypertensive in the PreOp area, 200's/120's.  She required 20 mg IV of Hydralazine PreOp to lower her BP to 180s/100s.  Patient was asymptomatic from a hypertension standpoint Pre and Post Op.  I informed her and her husband that I recommended that she go to urgent care today to establish care for BP control.  Patient and husband both voiced understanding.     Last Vitals:  Vitals:   03/25/18 1141 03/25/18 1151  BP: (!) 150/99 (!) 170/103  Pulse: (!) 101 (!) 106  Resp: 16 19  Temp:    SpO2: 99% 100%    Last Pain:  Vitals:   03/25/18 1151  TempSrc:   PainSc: (P) 0-No pain                 Precious Haws Wendell Nicoson

## 2018-03-25 NOTE — H&P (Signed)
Jonathon Bellows, MD [address] [place], [place], [place], [address] [id number], [place], [place], [place], [address] Phone: [phone]  Fax: [phone]  Primary Care Physician:  Patient, No Pcp Per   Pre-Procedure History & Physical: HPI:  Jessica Moore is a 52 y.o. female is here for an colonoscopy.   Past Medical History:  Diagnosis Date  . Anxiety     Past Surgical History:  Procedure Laterality Date  . HAND TENDON SURGERY    . UTERINE FIBROID EMBOLIZATION      Prior to Admission medications   Medication Sig Start Date End Date Taking? Authorizing Provider  Multiple Vitamin (MULTI VITAMIN PO) Take by mouth.    [provider]  norethindrone (MICRONOR,CAMILA,ERRIN) 0.35 MG tablet Take 1 tablet (0.35 mg total) by mouth daily. 03/05/18   Rubie Maid, MD  PARoxetine (PAXIL) 10 MG tablet Take 1 tablet (10 mg total) by mouth daily. Take 1 tablet daily by mouth in the evening. 03/18/18   Rubie Maid, MD    Allergies as of 03/11/2018  . (No Known Allergies)    Family History  Problem Relation Age of Onset  . Diabetes Mother     Social History   Socioeconomic History  . Marital status: Married    Spouse name: Not on file  . Number of children: Not on file  . Years of education: Not on file  . Highest education level: Not on file  Occupational History  . Not on file  Social Needs  . Financial resource strain: Not on file  . Food insecurity:    Worry: Not on file    Inability: Not on file  . Transportation needs:    Medical: Not on file    Non-medical: Not on file  Tobacco Use  . Smoking status: Never Smoker  . Smokeless tobacco: Never Used  Substance and Sexual Activity  . Alcohol use: Yes    Comment: occass  . Drug use: No  . Sexual activity: Yes    Birth control/protection: None  Lifestyle  . Physical activity:    Days per week: Not on file    Minutes per session: Not on file  . Stress: Not on file  Relationships    . Social connections:    Talks on phone: Not on file    Gets together: Not on file    Attends religious service: Not on file    Active member of club or organization: Not on file    Attends meetings of clubs or organizations: Not on file    Relationship status: Not on file  . Intimate partner violence:    Fear of current or ex partner: Not on file    Emotionally abused: Not on file    Physically abused: Not on file    Forced sexual activity: Not on file  Other Topics Concern  . Not on file  Social History Narrative  . Not on file    Review of Systems: See HPI, otherwise negative ROS  Physical Exam: BP (!) 206/129   Pulse 81   Temp (!) 96.5 F (35.8 C) (Tympanic)   Resp 16   Ht 5\' 7"  (1.702 m)   Wt 176 lb (79.8 kg)   SpO2 100%   BMI 27.57 kg/m  General:   Alert,  pleasant and cooperative in NAD Head:  Normocephalic and atraumatic. Neck:  Supple; no masses or thyromegaly. Lungs:  Clear throughout to auscultation, normal respiratory effort.  Heart:  +S1, +S2, Regular rate and rhythm, No edema. Abdomen:  Soft, nontender and nondistended. Normal bowel sounds, without guarding, and without rebound.   Neurologic:  Alert and  oriented x4;  grossly normal neurologically.  Impression/Plan: Jessica Moore is here for an colonoscopy to be performed for Screening colonoscopy average risk   Risks, benefits, limitations, and alternatives regarding  colonoscopy have been reviewed with the patient.  Questions have been answered.  All parties agreeable.   Jonathon Bellows, MD  03/25/2018, 9:44 AM

## 2018-03-25 NOTE — Anesthesia Preprocedure Evaluation (Addendum)
Anesthesia Evaluation  Patient identified by MRN, date of birth, ID band Patient awake    Reviewed: Allergy & Precautions, H&P , NPO status , Patient's Chart, lab work & pertinent test results  History of Anesthesia Complications Negative for: history of anesthetic complications  Airway Mallampati: II  TM Distance: >3 FB Neck ROM: full    Dental  (+) Chipped   Pulmonary neg pulmonary ROS, neg shortness of breath,           Cardiovascular Exercise Tolerance: Good hypertension, (-) angina(-) Past MI and (-) DOE      Neuro/Psych PSYCHIATRIC DISORDERS Anxiety negative neurological ROS     GI/Hepatic negative GI ROS, Neg liver ROS, neg GERD  ,  Endo/Other  negative endocrine ROS  Renal/GU negative Renal ROS  negative genitourinary   Musculoskeletal   Abdominal   Peds  Hematology negative hematology ROS (+)   Anesthesia Other Findings Past Medical History: No date: Anxiety  Past Surgical History: No date: HAND TENDON SURGERY No date: UTERINE FIBROID EMBOLIZATION  BMI    Body Mass Index:  27.57 kg/m      Reproductive/Obstetrics negative OB ROS                            Anesthesia Physical Anesthesia Plan  ASA: III  Anesthesia Plan: General   Post-op Pain Management:    Induction: Intravenous  PONV Risk Score and Plan: Propofol infusion and TIVA  Airway Management Planned: Natural Airway and Nasal Cannula  Additional Equipment:   Intra-op Plan:   Post-operative Plan:   Informed Consent: I have reviewed the patients History and Physical, chart, labs and discussed the procedure including the risks, benefits and alternatives for the proposed anesthesia with the patient or authorized representative who has indicated his/her understanding and acceptance.   Dental Advisory Given  Plan Discussed with: Anesthesiologist, CRNA and Surgeon  Anesthesia Plan Comments: (Patient has  uncontrolled HTN in the PreOp area 200's/120's.  Plan to control HTN with IV medications before the procedure.  Patient consented for risks of anesthesia including but not limited to:  - adverse reactions to medications - risk of intubation if required - damage to teeth, lips or other oral mucosa - sore throat or hoarseness - Damage to heart, brain, lungs or loss of life  Patient voiced understanding.)       Anesthesia Quick Evaluation

## 2018-03-25 NOTE — ED Notes (Signed)
FIRST NURSE NOTE: Pt brought to ER by [facility] staff after having colonoscopy today. Pt BP was 260/198 before colonoscopy and 177/109 after procedure. Pt alert and oriented X4, active, cooperative, pt in NAD. RR even and unlabored, color WNL.

## 2018-03-25 NOTE — Anesthesia Post-op Follow-up Note (Signed)
Anesthesia QCDR form completed.        

## 2018-03-25 NOTE — Transfer of Care (Signed)
Immediate Anesthesia Transfer of Care Note  Patient: Jessica Moore  Procedure(s) Performed: COLONOSCOPY WITH PROPOFOL (N/A )  Patient Location: PACU  Anesthesia Type:General  Level of Consciousness: awake, alert  and oriented  Airway & Oxygen Therapy: Patient Spontanous Breathing  Post-op Assessment: Report given to RN and Post -op Vital signs reviewed and stable  Post vital signs: Reviewed and stable  Last Vitals:  Vitals Value Taken Time  BP 138/88   Temp    Pulse 99 03/25/2018 11:32 AM  Resp 12 03/25/2018 11:32 AM  SpO2 100 % 03/25/2018 11:32 AM  Vitals shown include unvalidated device data.  Last Pain:  Vitals:   03/25/18 0923  TempSrc: Tympanic  PainSc: 0-No pain         Complications: No apparent anesthesia complications

## 2018-03-25 NOTE — ED Notes (Signed)
Spoke to [employer] about pt case. Per MD, no protocols at this time. Pt to go to flex care area.

## 2018-03-25 NOTE — Discharge Instructions (Addendum)
Follow-up with scheduled appointment to establish care with new PCP on Thursday September 2019 at 3 PM.

## 2018-03-25 NOTE — ED Notes (Addendum)
See triage note  Presents with elevated b/p today  States she was here for procedure and noticed that her b/p was up  Denies any any sx's  But is tearful  Was given hydralazine during the procedure

## 2018-03-25 NOTE — ED Provider Notes (Signed)
[facility] Emergency Department Provider Note   ____________________________________________   First MD Initiated Contact with Patient 03/25/18 1327     (approximate)  I have reviewed the triage vital signs and the nursing notes.   HISTORY  Chief Complaint Hypertension    HPI Jessica Moore is a 52 y.o. female patient sent from [place] clinic secondary to hypertension.  Patient reported to the clinic for colonoscopy.  It was noted her blood pressure was elevated.  Patient was given 20 mg of hydralazine in order to complete the procedure.  After procedure patient was sent to the ED for further management of her elevated blood pressure.  Patient state her OB doctor noticed her blood pressure elevated 2 weeks ago and have referred her to a family doctor.  I checked with the OB clinic in the referral clinic and found the patient appointment will not be until September 30.  Patient is asymptomatic but BP is 180/107.  Initial blood pressure at the clinic today was 200/110.  Past Medical History:  Diagnosis Date  . Hypertension     There are no active problems to display for this patient.   Past Surgical History:  Procedure Laterality Date  . HAND TENDON SURGERY    . UTERINE FIBROID EMBOLIZATION      Prior to Admission medications   Medication Sig Start Date End Date Taking? Authorizing Provider  hydrochlorothiazide (HYDRODIURIL) 25 MG tablet Take 1 tablet (25 mg total) by mouth daily. 03/25/18   Sable Feil, PA-C  Multiple Vitamin (MULTI VITAMIN PO) Take by mouth.    [provider]  norethindrone (MICRONOR,CAMILA,ERRIN) 0.35 MG tablet Take 1 tablet (0.35 mg total) by mouth daily. 03/05/18   Rubie Maid, MD  PARoxetine (PAXIL) 10 MG tablet Take 1 tablet (10 mg total) by mouth daily. Take 1 tablet daily by mouth in the evening. 03/18/18   Rubie Maid, MD    Allergies Patient has no known allergies.  Family History  Problem  Relation Age of Onset  . Diabetes Mother     Social History Social History   Tobacco Use  . Smoking status: Never Smoker  . Smokeless tobacco: Never Used  Substance Use Topics  . Alcohol use: Yes    Comment: occass  . Drug use: No    Review of Systems  Constitutional: No fever/chills Eyes: No visual changes. ENT: No sore throat. Cardiovascular: Denies chest pain. Respiratory: Denies shortness of breath. Gastrointestinal: No abdominal pain.  No nausea, no vomiting.  No diarrhea.  No constipation. Genitourinary: Negative for dysuria. Musculoskeletal: Negative for back pain. Skin: Negative for rash. Neurological: Negative for headaches, focal weakness or numbness. Endocrine:Hypertension   ____________________________________________   PHYSICAL EXAM:  VITAL SIGNS: ED Triage Vitals  Enc Vitals Group     BP 03/25/18 1255 (!) 180/107     Pulse Rate 03/25/18 1255 (!) 108     Resp 03/25/18 1255 18     Temp 03/25/18 1255 99 F (37.2 C)     Temp Source 03/25/18 1255 Oral     SpO2 03/25/18 1255 100 %     Weight 03/25/18 1255 176 lb (79.8 kg)     Height 03/25/18 1255 5\' 7"  (1.702 m)     Head Circumference --      Peak Flow --      Pain Score 03/25/18 1302 0     Pain Loc --      Pain Edu? --      Excl.  in [place]? --     Constitutional: Alert and oriented. Well appearing and in no acute distress.  Anxious Neck: No stridor.    Cardiovascular: Normal rate, regular rhythm. Grossly normal heart sounds.  Good peripheral circulation.  Elevated blood pressure Respiratory: Normal respiratory effort.  No retractions. Lungs CTAB. Gastrointestinal: Soft and nontender. No distention. No abdominal bruits. No CVA tenderness. Musculoskeletal: No lower extremity tenderness nor edema.  No joint effusions. Neurologic:  Normal speech and language. No gross focal neurologic deficits are appreciated. No gait instability. Skin:  Skin is warm, dry and intact. No rash noted. Psychiatric: Mood  and affect are normal. Speech and behavior are normal.  ____________________________________________   LABS (all labs ordered are listed, but only abnormal results are displayed)  Labs Reviewed - No data to display ____________________________________________  EKG   ____________________________________________  RADIOLOGY  ED MD interpretation:    Official radiology report(s): No results found.  ____________________________________________   PROCEDURES  Procedure(s) performed: None  Procedures  Critical Care performed: No  ____________________________________________   INITIAL IMPRESSION / ASSESSMENT AND PLAN / ED COURSE  As part of my medical decision making, I reviewed the following data within the [facility]    Patient was sent from the [facility] clinic secondary to elevated blood pressure.  I contacted patient OB doctor and found that [facility].  [facility] and was told the patient did not have a schedule appointment.  We scheduled patient to be seen by Dr. Ancil Boozer on June 30, 2018 at 3 PM.  Patient blood pressure decreased from 180/107 to 133//95.  Patient given discharge care instruction in a prescription for HCTZ at 25 mg daily until evaluation by new PCP.  Advised return to ED if condition worsens.   ____________________________________________   FINAL CLINICAL IMPRESSION(S) / ED DIAGNOSES  Final diagnoses:  Essential hypertension     ED Discharge Orders        Ordered    hydrochlorothiazide (HYDRODIURIL) 25 MG tablet  Daily     03/25/18 1508       Note:  This document was prepared using Dragon voice recognition software and may include unintentional dictation errors.    Sable Feil, PA-C 03/25/18 1515    Harvest Dark, MD 03/25/18 [phone]

## 2018-03-26 LAB — SURGICAL PATHOLOGY

## 2018-03-27 ENCOUNTER — Encounter: Payer: Self-pay | Admitting: Gastroenterology

## 2018-03-28 ENCOUNTER — Encounter: Payer: Self-pay | Admitting: Gastroenterology

## 2018-05-10 ENCOUNTER — Other Ambulatory Visit: Payer: Self-pay | Admitting: Obstetrics and Gynecology

## 2018-05-13 ENCOUNTER — Encounter: Payer: Self-pay | Admitting: Family Medicine

## 2018-06-30 ENCOUNTER — Ambulatory Visit: Payer: BLUE CROSS/BLUE SHIELD | Admitting: Family Medicine

## 2018-06-30 ENCOUNTER — Encounter: Payer: Self-pay | Admitting: Family Medicine

## 2018-06-30 VITALS — BP 126/72 | HR 98 | Temp 98.5°F | Resp 16 | Ht 67.0 in | Wt 177.9 lb

## 2018-06-30 DIAGNOSIS — E049 Nontoxic goiter, unspecified: Secondary | ICD-10-CM | POA: Diagnosis not present

## 2018-06-30 DIAGNOSIS — R61 Generalized hyperhidrosis: Secondary | ICD-10-CM

## 2018-06-30 DIAGNOSIS — Z23 Encounter for immunization: Secondary | ICD-10-CM | POA: Diagnosis not present

## 2018-06-30 DIAGNOSIS — D259 Leiomyoma of uterus, unspecified: Secondary | ICD-10-CM | POA: Diagnosis not present

## 2018-06-30 DIAGNOSIS — Z131 Encounter for screening for diabetes mellitus: Secondary | ICD-10-CM

## 2018-06-30 DIAGNOSIS — Z1159 Encounter for screening for other viral diseases: Secondary | ICD-10-CM

## 2018-06-30 DIAGNOSIS — I10 Essential (primary) hypertension: Secondary | ICD-10-CM

## 2018-06-30 DIAGNOSIS — N951 Menopausal and female climacteric states: Secondary | ICD-10-CM

## 2018-06-30 DIAGNOSIS — Z114 Encounter for screening for human immunodeficiency virus [HIV]: Secondary | ICD-10-CM

## 2018-06-30 MED ORDER — HYDROCHLOROTHIAZIDE 12.5 MG PO TABS: 12.5000 mg | ORAL_TABLET | Freq: Every day | ORAL | 0 refills | Status: DC

## 2018-06-30 MED ORDER — CLONIDINE HCL 0.1 MG PO TABS: 0.1000 mg | ORAL_TABLET | Freq: Every day | ORAL | 0 refills | Status: DC

## 2018-06-30 NOTE — Addendum Note (Signed)
Addended by: Steele Sizer F on: 06/30/2018 04:55 PM   Modules accepted: Orders

## 2018-06-30 NOTE — Progress Notes (Signed)
Name: Jessica Moore   MRN: [id number]    DOB: 06/16/1966   Date:06/30/2018       Progress Note  Subjective  Chief Complaint  Chief Complaint  Patient presents with  . Establish Care  . Hypertension    Had Colonoscopy and BP was elevated.  . Menopause    Paxil is making her have vivid dreams and would like to discuss with [place] started her on this medication for hot flashes, night sweats making her restless at night, mood swings, weight gain.     HPI  HTN: diagnosed when she had colonoscopy and followed up at the [facility], she has been taking hctz 25 mg daily and denies side effects, bp is at goal, no chest pain or palpitation. We will try decreasing to 12.5 mg daily and check labs today.   Perimenopause: seen by Dr. Marcelline Mates and given reassurance, having hot flashes, night sweats and mood swings. She was given paxil and was doing well for a while, but lately she has noticed vivid dreams. She states not sure if secondary to watching [place] and [place] at night. She is still taking Paxil but thinking about stopping because of the dreams. She states she seems to worry more than usual. She states worries about her son that is obese and has OSA. Discussed adding clonidine since mood seems to have improved with paxil  Goiter: she noticed a lump on her throat a couple of months ago, non tender, she noticed while driving, needs to clear her throat, no palpitation.   Insomnia: since she got married, husband likes to watch TV in bed, he goes to bed after she does and also TV on wakes her up. Discussed sleep hygiene   Patient Active Problem List   Diagnosis Date Noted  . Benign hypertension 06/30/2018  . Goiter 06/30/2018  . Uterine fibroid 06/30/2018  . Perimenopausal symptom 06/30/2018    Past Surgical History:  Procedure Laterality Date  . COLONOSCOPY WITH PROPOFOL N/A 03/25/2018   Procedure: COLONOSCOPY WITH PROPOFOL;  Surgeon: Jonathon Bellows, MD;  Location: [facility] ENDOSCOPY;   Service: Gastroenterology;  Laterality: N/A;  . HAND TENDON SURGERY Right 1985   [place]  . UTERINE FIBROID EMBOLIZATION      Family History  Problem Relation Age of Onset  . Diabetes Mother   . Hypertension Maternal Grandmother   . Alcohol abuse Maternal Grandmother     Social History   Socioeconomic History  . Marital status: Married    Spouse name: Legrand Como  . Number of children: 3  . Years of education: Not on file  . Highest education level: Associate degree: academic program  Occupational History  . Occupation: Therapist, sports  Social Needs  . Financial resource strain: Not hard at all  . Food insecurity:    Worry: Never true    Inability: Never true  . Transportation needs:    Medical: No    Non-medical: No  Tobacco Use  . Smoking status: Never Smoker  . Smokeless tobacco: Never Used  Substance and Sexual Activity  . Alcohol use: Yes    Comment: occass  . Drug use: No  . Sexual activity: Yes    Partners: Male    Birth control/protection: None  Lifestyle  . Physical activity:    Days per week: 5 days    Minutes per session: 60 min  . Stress: Only a little  Relationships  . Social connections:    Talks on phone: More than three times a week  Gets together: More than three times a week    Attends religious service: More than 4 times per year    Active member of club or organization: No    Attends meetings of clubs or organizations: Never    Relationship status: Married  . Intimate partner violence:    Fear of current or ex partner: No    Emotionally abused: No    Physically abused: No    Forced sexual activity: No  Other Topics Concern  . Not on file  Social History Narrative   She is originally from [place] in 2014 because of her current husband, they got married in 2015   She has 3 grown children still in [place].    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      Current Outpatient Medications:   .  hydrochlorothiazide (HYDRODIURIL) 12.5 MG tablet, Take 1 tablet (12.5 mg total) by mouth daily., Disp: 90 tablet, Rfl: 0 .  Multiple Vitamin (MULTI VITAMIN PO), Take by mouth., Disp: , Rfl:  .  norethindrone (MICRONOR,CAMILA,ERRIN) 0.35 MG tablet, Take 1 tablet (0.35 mg total) by mouth daily., Disp: 1 Package, Rfl: 11 .  PARoxetine (PAXIL) 10 MG tablet, TAKE 1 TABLET DAILY BY MOUTH IN THE EVENING., Disp: 90 tablet, Rfl: 3 .  cloNIDine (CATAPRES) 0.1 MG tablet, Take 1 tablet (0.1 mg total) by mouth at bedtime., Disp: 90 tablet, Rfl: 0  No Known Allergies  I personally reviewed active problem list, medication list, allergies, family history, social history with the patient/caregiver today.   ROS  Constitutional: Negative for fever or weight change.  Respiratory: Negative for cough and shortness of breath.   Cardiovascular: Negative for chest pain or palpitations.  Gastrointestinal: Negative for abdominal pain, no bowel changes.  Musculoskeletal: Negative for gait problem or joint swelling.  Skin: Negative for rash.  Neurological: Negative for dizziness or headache.  No other specific complaints in a complete review of systems (except as listed in HPI above).  Objective  Vitals:   06/30/18 1505  BP: 126/72  Pulse: 98  Resp: 16  Temp: 98.5 F (36.9 C)  TempSrc: Oral  SpO2: 98%  Weight: 177 lb 14.4 oz (80.7 kg)  Height: 5\' 7"  (1.702 m)    Body mass index is 27.86 kg/m.  Physical Exam  Constitutional: Patient appears well-developed and well-nourished. Overweight.  No distress.  HEENT: head atraumatic, normocephalic, pupils equal and reactive to light,enlarged thyroid seems larger on left side of neck, neck supple, throat within normal limits Cardiovascular: Normal rate, regular rhythm and normal heart sounds.  No murmur heard. No BLE edema. Pulmonary/Chest: Effort normal and breath sounds normal. No respiratory distress. Abdominal: Soft.  There is no  tenderness. Psychiatric: Patient has a normal mood and affect. behavior is normal. Judgment and thought content normal.  PHQ2/9: Depression screen PHQ 2/9 06/30/2018  Decreased Interest 0  Down, Depressed, Hopeless 0  PHQ - 2 Score 0  Altered sleeping 2  Tired, decreased energy 3  Change in appetite 1  Feeling bad or failure about yourself  0  Trouble concentrating 0  Moving slowly or fidgety/restless 0  Suicidal thoughts 0  PHQ-9 Score 6  Difficult doing work/chores Not difficult at all    GAD 7 : Generalized Anxiety Score 06/30/2018  Nervous, Anxious, on Edge 0  Control/stop worrying 1  Worry too much - different things 1  Trouble relaxing 1  Restless 0  Easily  annoyed or irritable 0  Afraid - awful might happen 0  Total GAD 7 Score 3  Anxiety Difficulty Not difficult at all    Fall Risk: Fall Risk  06/30/2018  Falls in the past year? No    Functional Status Survey: Is the patient deaf or have difficulty hearing?: No Does the patient have difficulty seeing, even when wearing glasses/contacts?: Yes(contacts) Does the patient have difficulty concentrating, remembering, or making decisions?: No Does the patient have difficulty walking or climbing stairs?: No Does the patient have difficulty dressing or bathing?: No Does the patient have difficulty doing errands alone such as visiting a doctor's office or shopping?: No    Assessment & Plan  1. Benign hypertension  - BASIC METABOLIC PANEL WITH GFR - hydrochlorothiazide (HYDRODIURIL) 12.5 MG tablet; Take 1 tablet (12.5 mg total) by mouth daily.  Dispense: 90 tablet; Refill: 0 - EKG done for baseline, some ST changes, no symptoms. We will hold off on referral to cardiologist at this time  2. Need for immunization against influenza  refused  3. Goiter  - Thyroid Panel With TSH - Ambulatory referral to Endocrinology  4. Uterine leiomyoma, unspecified location   5. Need for hepatitis C screening test  -  Hepatitis C Antibody  6. Encounter for screening for HIV  - HIV Antibody (routine testing w rflx)  7. Need for Tdap vaccination  - Tdap vaccine greater than or equal to 7yo IM  8. Diabetes mellitus screening  - HgB A1c  9. Perimenopausal symptom  - cloNIDine (CATAPRES) 0.1 MG tablet; Take 1 tablet (0.1 mg total) by mouth at bedtime.  Dispense: 90 tablet; Refill: 0  10. Night sweats  - cloNIDine (CATAPRES) 0.1 MG tablet; Take 1 tablet (0.1 mg total) by mouth at bedtime.  Dispense: 90 tablet; Refill: 0

## 2018-07-02 LAB — BASIC METABOLIC PANEL WITH GFR
BUN: 15 mg/dL (ref 7–25)
CO2: 30 mmol/L (ref 20–32)
CREATININE: 0.92 mg/dL (ref 0.50–1.05)
Calcium: 9.8 mg/dL (ref 8.6–10.4)
Chloride: 99 mmol/L (ref 98–110)
GFR, Est African American: 82 mL/min/{1.73_m2} (ref >=60)
GFR, Est Non African American: 71 mL/min/{1.73_m2} (ref >=60)
GLUCOSE: 89 mg/dL (ref 65–99)
Potassium: 3.8 mmol/L (ref 3.5–5.3)
SODIUM: 139 mmol/L (ref 135–146)

## 2018-07-02 LAB — HIV ANTIBODY (ROUTINE TESTING W REFLEX): HIV 1&2 Ab, 4th Generation: NONREACTIVE

## 2018-07-02 LAB — HEMOGLOBIN A1C
EAG (MMOL/L): 6.6 (calc)
Hgb A1c MFr Bld: 5.8 % of total Hgb — ABNORMAL HIGH (ref <5.7)
Mean Plasma Glucose: 120 (calc)

## 2018-07-02 LAB — THYROID PANEL WITH TSH
Free Thyroxine Index: 2.5 (ref 1.4–3.8)
T3 UPTAKE: 28 % (ref 22–35)
T4, Total: 8.9 ug/dL (ref 5.1–11.9)
TSH: 1.35 m[IU]/L

## 2018-07-02 LAB — HEPATITIS C ANTIBODY
Hepatitis C Ab: NONREACTIVE
SIGNAL TO CUT-OFF: 0.03 (ref <1.00)

## 2018-07-10 ENCOUNTER — Ambulatory Visit
Admission: RE | Admit: 2018-07-10 | Discharge: 2018-07-10 | Disposition: A | Discharge status: Home / Self Care | Payer: BLUE CROSS/BLUE SHIELD | Source: Ambulatory Visit | Attending: Obstetrics and Gynecology | Admitting: Obstetrics and Gynecology

## 2018-07-10 DIAGNOSIS — Z1239 Encounter for other screening for malignant neoplasm of breast: Secondary | ICD-10-CM | POA: Insufficient documentation

## 2018-07-10 DIAGNOSIS — Z0000 Encounter for general adult medical examination without abnormal findings: Secondary | ICD-10-CM | POA: Insufficient documentation

## 2018-07-10 DIAGNOSIS — Z01419 Encounter for gynecological examination (general) (routine) without abnormal findings: Secondary | ICD-10-CM | POA: Insufficient documentation

## 2018-07-30 ENCOUNTER — Other Ambulatory Visit: Payer: Self-pay | Admitting: Obstetrics and Gynecology

## 2018-07-30 DIAGNOSIS — R928 Other abnormal and inconclusive findings on diagnostic imaging of breast: Secondary | ICD-10-CM

## 2018-07-30 DIAGNOSIS — N6489 Other specified disorders of breast: Secondary | ICD-10-CM

## 2018-09-02 ENCOUNTER — Other Ambulatory Visit: Payer: Self-pay | Admitting: Obstetrics and Gynecology

## 2018-09-02 ENCOUNTER — Ambulatory Visit
Admission: RE | Admit: 2018-09-02 | Discharge: 2018-09-02 | Disposition: A | Discharge status: Home / Self Care | Payer: BLUE CROSS/BLUE SHIELD | Source: Ambulatory Visit | Attending: Obstetrics and Gynecology | Admitting: Obstetrics and Gynecology

## 2018-09-02 DIAGNOSIS — R928 Other abnormal and inconclusive findings on diagnostic imaging of breast: Secondary | ICD-10-CM | POA: Insufficient documentation

## 2018-09-02 DIAGNOSIS — N6489 Other specified disorders of breast: Secondary | ICD-10-CM | POA: Diagnosis present

## 2018-09-02 DIAGNOSIS — N6310 Unspecified lump in the right breast, unspecified quadrant: Secondary | ICD-10-CM

## 2018-09-03 ENCOUNTER — Other Ambulatory Visit: Payer: Self-pay | Admitting: Family Medicine

## 2018-09-03 DIAGNOSIS — N951 Menopausal and female climacteric states: Secondary | ICD-10-CM

## 2018-09-03 DIAGNOSIS — R61 Generalized hyperhidrosis: Secondary | ICD-10-CM

## 2018-09-03 NOTE — Telephone Encounter (Signed)
Hypertension medication request: Clonidine to [facility]   Last office visit pertaining to hypertension: 06/30/2018   BP Readings from Last 3 Encounters:  06/30/18 126/72  03/25/18 (!) 133/95  03/25/18 (!) 155/98    Lab Results  Component Value Date   CREATININE 0.92 07/01/2018   BUN 15 07/01/2018   NA 139 07/01/2018   K 3.8 07/01/2018   CL 99 07/01/2018   CO2 30 07/01/2018     Follow up 10/08/2018

## 2018-09-08 ENCOUNTER — Ambulatory Visit
Admission: RE | Admit: 2018-09-08 | Discharge: 2018-09-08 | Disposition: A | Discharge status: Home / Self Care | Payer: BLUE CROSS/BLUE SHIELD | Source: Ambulatory Visit | Attending: Obstetrics and Gynecology | Admitting: Obstetrics and Gynecology

## 2018-09-08 DIAGNOSIS — N6310 Unspecified lump in the right breast, unspecified quadrant: Secondary | ICD-10-CM | POA: Diagnosis not present

## 2018-09-08 DIAGNOSIS — R928 Other abnormal and inconclusive findings on diagnostic imaging of breast: Secondary | ICD-10-CM | POA: Diagnosis present

## 2018-09-09 LAB — SURGICAL PATHOLOGY

## 2018-09-22 ENCOUNTER — Other Ambulatory Visit: Payer: Self-pay | Admitting: Family Medicine

## 2018-09-22 DIAGNOSIS — I10 Essential (primary) hypertension: Secondary | ICD-10-CM

## 2018-09-22 NOTE — Telephone Encounter (Signed)
Refill request for Hypertension medication:  HCTZ 12.5 mg  Last office visit pertaining to hypertension: 06/30/2018  BP Readings from Last 3 Encounters:  06/30/18 126/72  03/25/18 (!) 133/95  03/25/18 (!) 155/98     Lab Results  Component Value Date   CREATININE 0.92 07/01/2018   BUN 15 07/01/2018   NA 139 07/01/2018   K 3.8 07/01/2018   CL 99 07/01/2018   CO2 30 07/01/2018   Follow-ups on file. 10/08/2018

## 2018-09-29 ENCOUNTER — Other Ambulatory Visit: Payer: Self-pay | Admitting: Family Medicine

## 2018-09-29 DIAGNOSIS — N951 Menopausal and female climacteric states: Secondary | ICD-10-CM

## 2018-09-29 DIAGNOSIS — I10 Essential (primary) hypertension: Secondary | ICD-10-CM

## 2018-09-29 DIAGNOSIS — R61 Generalized hyperhidrosis: Secondary | ICD-10-CM

## 2018-10-08 ENCOUNTER — Encounter: Payer: Self-pay | Admitting: Family Medicine

## 2018-10-08 ENCOUNTER — Ambulatory Visit (INDEPENDENT_AMBULATORY_CARE_PROVIDER_SITE_OTHER): Payer: BLUE CROSS/BLUE SHIELD | Admitting: Family Medicine

## 2018-10-08 VITALS — BP 136/84 | HR 83 | Temp 97.9°F | Resp 16 | Ht 67.0 in | Wt 174.5 lb

## 2018-10-08 DIAGNOSIS — N951 Menopausal and female climacteric states: Secondary | ICD-10-CM | POA: Diagnosis not present

## 2018-10-08 DIAGNOSIS — I10 Essential (primary) hypertension: Secondary | ICD-10-CM

## 2018-10-08 DIAGNOSIS — G4700 Insomnia, unspecified: Secondary | ICD-10-CM

## 2018-10-08 DIAGNOSIS — E049 Nontoxic goiter, unspecified: Secondary | ICD-10-CM | POA: Diagnosis not present

## 2018-10-08 NOTE — Progress Notes (Addendum)
Name: Jessica Moore   MRN: [id number]    DOB: 24-Mar-1966   Date:10/08/2018       Progress Note  Subjective  Chief Complaint  Chief Complaint  Patient presents with  . Medication Refill  . Hypertension    Slight headache if she forgets to take it  . Menopause    Hot Flashes are still waking her up  . Goiter  . Insomnia    HPI  HTN: diagnosed when she had colonoscopy 03/2018 and had to follow up at the  [facility] because bp was 200/110 during the procedure and 180/107 at [facility], she was HCTZ 25 mg . She denies  chest pain or palpitation. We decreased dose of HcTZ on her last visit HTN./ We decrease HCTZ to 12.5 mg daily on her last visit 06/2018 and bp at home and also during visit to Urgent care 09/04/2018 was also at goal, however bp is high today. She states gets nervous during office visits and also states had bp 3 times today because first CMA could not get the reading and was upset about it.   Perimenopause: seen by Dr. Marcelline Mates and given reassurance, having hot flashes, night sweats and mood swings. She was given paxil and was doing well for a while, but lately she has noticed vivid dreams. She states not sure if secondary to watching [place] and [place] at night. She is still taking Paxil but thinking about stopping because of the dreams. She is also taking Clonidine qhs, she states since started on clonidine vivid dreams not as intense and usually able to sleep except when she has severe hot flashes, sleep is still disrupted, wakes up at least twice per night.   Goiter: she noticed a lump on her throat a couple of months ago, non tender, she noticed while driving, needs to clear her throat, no palpitation. She was referred to Dr. Gabriel Carina and was supposed to have [place] prior to visit but never got a phone call, I will give her the number to her office today.   Insomnia: since she got married, husband likes to watch TV in bed, she states she has been lowering the TV volume and is wearing a mask to  keep light away.  She is also having problems sleeping because of night sweats.    Patient Active Problem List   Diagnosis Date Noted  . Benign hypertension 06/30/2018  . Goiter 06/30/2018  . Uterine fibroid 06/30/2018  . Perimenopausal symptom 06/30/2018    Past Surgical History:  Procedure Laterality Date  . COLONOSCOPY WITH PROPOFOL N/A 03/25/2018   Procedure: COLONOSCOPY WITH PROPOFOL;  Surgeon: Jonathon Bellows, MD;  Location: [facility] ENDOSCOPY;  Service: Gastroenterology;  Laterality: N/A;  . HAND TENDON SURGERY Right 1985   Pinky Finger  . UTERINE FIBROID EMBOLIZATION      Family History  Problem Relation Age of Onset  . Diabetes Mother   . Hypertension Maternal Grandmother   . Alcohol abuse Maternal Grandmother   . Breast cancer Neg Hx     Social History   Socioeconomic History  . Marital status: Married    Spouse name: Legrand Como  . Number of children: 3  . Years of education: Not on file  . Highest education level: Associate degree: academic program  Occupational History  . Occupation: Therapist, sports  Social Needs  . Financial resource strain: Not hard at all  . Food insecurity:    Worry: Never true    Inability: Never true  . Transportation needs:  Medical: No    Non-medical: No  Tobacco Use  . Smoking status: Never Smoker  . Smokeless tobacco: Never Used  Substance and Sexual Activity  . Alcohol use: Yes    Comment: occass  . Drug use: No  . Sexual activity: Yes    Partners: Male    Birth control/protection: None  Lifestyle  . Physical activity:    Days per week: 5 days    Minutes per session: 60 min  . Stress: Only a little  Relationships  . Social connections:    Talks on phone: More than three times a week    Gets together: More than three times a week    Attends religious service: More than 4 times per year    Active member of club or organization: No    Attends meetings of clubs or organizations: Never    Relationship status: Married  . Intimate  partner violence:    Fear of current or ex partner: No    Emotionally abused: No    Physically abused: No    Forced sexual activity: No  Other Topics Concern  . Not on file  Social History Narrative   She is originally from [place] in 2014 because of her current husband, they got married in 2015   She has 3 grown children still in [place].    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       Current Outpatient Medications:  .  cloNIDine (CATAPRES) 0.1 MG tablet, TAKE 1 TABLET (0.1 MG TOTAL) BY MOUTH AT BEDTIME., Disp: 90 tablet, Rfl: 0 .  hydrochlorothiazide (HYDRODIURIL) 12.5 MG tablet, TAKE 1 TABLET BY MOUTH EVERY DAY, Disp: 90 tablet, Rfl: 0 .  Multiple Vitamin (MULTI VITAMIN PO), Take by mouth., Disp: , Rfl:  .  norethindrone (MICRONOR,CAMILA,ERRIN) 0.35 MG tablet, Take 1 tablet (0.35 mg total) by mouth daily., Disp: 1 Package, Rfl: 11 .  PARoxetine (PAXIL) 10 MG tablet, TAKE 1 TABLET DAILY BY MOUTH IN THE EVENING., Disp: 90 tablet, Rfl: 3  No Known Allergies  I personally reviewed active problem list, medication list, allergies, family history, social history with the patient/caregiver today.   ROS  Constitutional: Negative for fever or significant weight change.  Respiratory: Negative for cough and shortness of breath.   Cardiovascular: Negative for chest pain or palpitations.  Gastrointestinal: Negative for abdominal pain, no bowel changes.  Musculoskeletal: Negative for gait problem or joint swelling.  Skin: Negative for rash.  Neurological: Negative for dizziness , positive for intermittent headache.  No other specific complaints in a complete review of systems (except as listed in HPI above).  Objective  Vitals:   10/08/18 1343 10/08/18 1424  BP: (!) 152/100 136/84  Pulse: 83   Resp: 16   Temp: 97.9 F (36.6 C)   TempSrc: Oral   SpO2: 95%   Weight: 174 lb 8 oz (79.2 kg)   Height: 5\' 7"  (1.702 m)      Body mass index is 27.33 kg/m.  Physical Exam  Constitutional: Patient appears well-developed and well-nourished. Overweight. No distress.  HEENT: head atraumatic, normocephalic, pupils equal and reactive to light,  neck supple, throat within normal limits. Goiter - needs to see Dr. Gabriel Carina  Cardiovascular: Normal rate, regular rhythm and normal heart sounds.  No murmur heard. No BLE edema. Pulmonary/Chest: Effort normal and breath sounds normal. No respiratory distress. Abdominal: Soft.  There is no tenderness. Psychiatric: Patient has  a normal mood and affect. behavior is normal. Judgment and thought content normal.  Recent Results (from the past 2160 hour(s))  Surgical pathology     Status: None   Collection Time: 09/08/18  8:59 AM  Result Value Ref Range   SURGICAL PATHOLOGY      Surgical Pathology CASE: [id number] PATIENT: Evelena Peat Surgical Pathology Report     SPECIMEN SUBMITTED: A. Breast, right  CLINICAL HISTORY: 53 year old female with irregular hypoechoic mass in the right breast at the 3:00 position 6 cm from nipple and measuring 0.5 x 0.5 x 0.7 cm  PRE-OPERATIVE DIAGNOSIS: Cyst/FCC vs adenosis vs apocrine metaplasia vs CA  POST-OPERATIVE DIAGNOSIS: Heart shaped clip placed     DIAGNOSIS: A.  BREAST, RIGHT 3:00; ULTRASOUND-GUIDED BIOPSY: - BENIGN BREAST TISSUE WITH PSEUDO-ANGIOMATOUS STROMAL HYPERPLASIA. - NEGATIVE FOR ATYPIA AND MALIGNANCY.  GROSS DESCRIPTION: A. Labeled: Right breast 3:00 Received: In formalin Time/date in fixative: 8:49 AM on 09/08/2018 Cold ischemic time: Less than 1 minute Total fixation time: 8.25 hours Core pieces: Multiple Size: Aggregate, 1.4 x 0.8 x 0.1 cm Description: Yellow to red cores and fragments Ink color: Blue Entirely submitted in 1 cassette.    Final Diagnosis per formed by Quay Burow, MD.   Electronically signed 09/09/2018 1:38:24PM The electronic signature indicates that the named  Attending Pathologist has evaluated the specimen  Technical component performed at [facility], [address] [place], [place] [address] Lab: [phone] Dir: Rush Farmer, MD, MMM  Professional component performed at [facility], [facility], [place], [place], [place] [address] Lab: [phone] Dir: Dellia Nims. Rubinas, MD       PHQ2/9: Depression screen [facility] 2/9 06/30/2018  Decreased Interest 0  Down, Depressed, Hopeless 0  PHQ - 2 Score 0  Altered sleeping 2  Tired, decreased energy 3  Change in appetite 1  Feeling bad or failure about yourself  0  Trouble concentrating 0  Moving slowly or fidgety/restless 0  Suicidal thoughts 0  PHQ-9 Score 6  Difficult doing work/chores Not difficult at all    Fall Risk: Fall Risk  10/08/2018 06/30/2018  Falls in the past year? 0 No  Number falls in past yr: 0 -  Injury with Fall? 0 -    Functional Status Survey: Is the patient deaf or have difficulty hearing?: No Does the patient have difficulty seeing, even when wearing glasses/contacts?: No Does the patient have difficulty concentrating, remembering, or making decisions?: No Does the patient have difficulty walking or climbing stairs?: No Does the patient have difficulty dressing or bathing?: No Does the patient have difficulty doing errands alone such as visiting a doctor's office or shopping?: No    Assessment & Plan  1. Benign hypertension  She will return in one week for bp check with CMA only   2. Goiter  Advised her to contact Dr. Gabriel Carina   3. Perimenopausal symptom  Still struggling with hot flashes, she may need to see Dr. Marcelline Mates again   4. Insomnia, unspecified type  Discussed sleep hygiene

## 2018-10-09 ENCOUNTER — Ambulatory Visit: Payer: BLUE CROSS/BLUE SHIELD

## 2018-10-15 ENCOUNTER — Ambulatory Visit (INDEPENDENT_AMBULATORY_CARE_PROVIDER_SITE_OTHER): Payer: BLUE CROSS/BLUE SHIELD

## 2018-10-15 VITALS — BP 130/90 | HR 70

## 2018-10-15 DIAGNOSIS — I10 Essential (primary) hypertension: Secondary | ICD-10-CM

## 2018-10-15 NOTE — Progress Notes (Signed)
Patient is here for a blood pressure check. Patient denies chest pain, palpitations, shortness of breath or visual disturbances. At previous visit blood pressure was 136/84 with a heart rate of 83. Today during nurse visit first check blood pressure was 150/100 with a heart rate of 75. After resting for 10 minutes it was 130/90 and heart rate was 70. She does take any blood pressure medications as prescribed. She has not missed any doses of her medication.  Review by PCP and she recommended patient to continue current medication and follow up in 1 week for BP check with CMA. Check BP after patient has been sitting for 5 minutes.

## 2018-10-22 ENCOUNTER — Ambulatory Visit (INDEPENDENT_AMBULATORY_CARE_PROVIDER_SITE_OTHER): Payer: BLUE CROSS/BLUE SHIELD

## 2018-10-22 VITALS — BP 150/100 | HR 64

## 2018-10-22 DIAGNOSIS — I10 Essential (primary) hypertension: Secondary | ICD-10-CM

## 2018-10-22 MED ORDER — VALSARTAN-HYDROCHLOROTHIAZIDE 160-12.5 MG PO TABS: 1.0000 | ORAL_TABLET | Freq: Every day | ORAL | 0 refills | Status: DC

## 2018-10-22 NOTE — Progress Notes (Signed)
Patient is here for a blood pressure check. Patient denies chest pain, palpitations, shortness of breath or visual disturbances. At previous visit blood pressure was 130/90 with a heart rate of 70. Today during nurse visit first check blood pressure was 154/98. After resting for 10 minutes it was 150/100 and heart rate was.She does take blood pressure medications as prescribed and denies missing any doses.  PCP changed medication to Diovan. Follow up in one week for BP check.

## 2018-10-29 ENCOUNTER — Ambulatory Visit (INDEPENDENT_AMBULATORY_CARE_PROVIDER_SITE_OTHER): Payer: BLUE CROSS/BLUE SHIELD

## 2018-10-29 VITALS — BP 142/82 | HR 65

## 2018-10-29 DIAGNOSIS — I10 Essential (primary) hypertension: Secondary | ICD-10-CM

## 2018-10-29 NOTE — Progress Notes (Signed)
Patient is here for a blood pressure check. Patient denies chest pain, palpitations, shortness of breath or visual disturbances. At previous visit blood pressure was 150/100 with a heart rate of 64. Today during nurse visit first check blood pressure was 140/90 with heart rate of 65. After resting for 10 minutes it was 142/82. She does take blood pressure medications as prescribed. She has not had any missed doses.  Patient was evaluated by PCP. She noted that bp was much better. Continue medication and follow up for routine appt. Patient reports bilateral breast tenderness since starting medication.

## 2018-11-13 ENCOUNTER — Other Ambulatory Visit: Payer: Self-pay | Admitting: Family Medicine

## 2018-11-13 ENCOUNTER — Encounter: Payer: Self-pay | Admitting: Family Medicine

## 2018-11-13 DIAGNOSIS — I10 Essential (primary) hypertension: Secondary | ICD-10-CM

## 2018-11-13 MED ORDER — VALSARTAN-HYDROCHLOROTHIAZIDE 320-12.5 MG PO TABS: 1.0000 | ORAL_TABLET | Freq: Every day | ORAL | 0 refills | Status: DC

## 2018-12-21 ENCOUNTER — Other Ambulatory Visit: Payer: Self-pay | Admitting: Family Medicine

## 2018-12-21 DIAGNOSIS — R61 Generalized hyperhidrosis: Secondary | ICD-10-CM

## 2018-12-21 DIAGNOSIS — I10 Essential (primary) hypertension: Secondary | ICD-10-CM

## 2018-12-21 DIAGNOSIS — N951 Menopausal and female climacteric states: Secondary | ICD-10-CM

## 2018-12-29 ENCOUNTER — Telehealth: Payer: Self-pay

## 2018-12-29 ENCOUNTER — Other Ambulatory Visit: Payer: Self-pay | Admitting: Obstetrics and Gynecology

## 2018-12-29 NOTE — Telephone Encounter (Signed)
Copied from [place] [id number]. Topic: General - Other >> Dec 26, 2018  4:43 PM Richardo Priest, NT wrote: Reason for CRM:  Patient called on voicemail stating she would like to speak with someone about getting some medication for a possible UTI.

## 2018-12-29 NOTE — Telephone Encounter (Signed)
Pt made appt for tomorrow

## 2018-12-30 ENCOUNTER — Other Ambulatory Visit: Payer: Self-pay

## 2018-12-30 ENCOUNTER — Ambulatory Visit (INDEPENDENT_AMBULATORY_CARE_PROVIDER_SITE_OTHER): Payer: BLUE CROSS/BLUE SHIELD | Admitting: Family Medicine

## 2018-12-30 ENCOUNTER — Encounter: Payer: Self-pay | Admitting: Family Medicine

## 2018-12-30 VITALS — BP 140/80 | HR 95 | Temp 98.3°F | Ht 67.0 in | Wt 182.2 lb

## 2018-12-30 DIAGNOSIS — R61 Generalized hyperhidrosis: Secondary | ICD-10-CM | POA: Diagnosis not present

## 2018-12-30 DIAGNOSIS — R399 Unspecified symptoms and signs involving the genitourinary system: Secondary | ICD-10-CM

## 2018-12-30 DIAGNOSIS — R058 Other specified cough: Secondary | ICD-10-CM

## 2018-12-30 DIAGNOSIS — J302 Other seasonal allergic rhinitis: Secondary | ICD-10-CM

## 2018-12-30 DIAGNOSIS — E049 Nontoxic goiter, unspecified: Secondary | ICD-10-CM

## 2018-12-30 DIAGNOSIS — I10 Essential (primary) hypertension: Secondary | ICD-10-CM

## 2018-12-30 DIAGNOSIS — N951 Menopausal and female climacteric states: Secondary | ICD-10-CM

## 2018-12-30 DIAGNOSIS — J3089 Other allergic rhinitis: Secondary | ICD-10-CM

## 2018-12-30 DIAGNOSIS — R05 Cough: Secondary | ICD-10-CM

## 2018-12-30 LAB — POCT URINALYSIS DIPSTICK
Appearance: NORMAL
Bilirubin, UA: NEGATIVE
Blood, UA: NEGATIVE
Glucose, UA: NEGATIVE
Ketones, UA: NEGATIVE
Leukocytes, UA: NEGATIVE
Nitrite, UA: NEGATIVE
Odor: NORMAL
Protein, UA: NEGATIVE
Spec Grav, UA: 1.02 (ref 1.010–1.025)
UROBILINOGEN UA: 0.2 U/dL
pH: 6

## 2018-12-30 MED ORDER — CLONIDINE HCL 0.1 MG PO TABS: 0.1000 mg | ORAL_TABLET | Freq: Every day | ORAL | 0 refills | Status: DC

## 2018-12-30 MED ORDER — MONTELUKAST SODIUM 10 MG PO TABS: 10.0000 mg | ORAL_TABLET | Freq: Every day | ORAL | 0 refills | Status: DC

## 2018-12-30 MED ORDER — VALSARTAN-HYDROCHLOROTHIAZIDE 320-12.5 MG PO TABS: 1.0000 | ORAL_TABLET | Freq: Every day | ORAL | 0 refills | Status: DC

## 2018-12-30 NOTE — Progress Notes (Signed)
Name: Jessica Moore   MRN: [id number]    DOB: January 09, 1966   Date:12/30/2018       Progress Note  Subjective  Chief Complaint  Chief Complaint  Patient presents with  . urinary tract symptoms    HPI  Urine problems: she states over the past month she has been having intermittent pelvic pain, and past two days dark urine, no dysuria, no hematuria, but has noticed mild right lower back pain. Worried she may have an UTI. LMP one month ago. No change in bowel movements, no fever or chills. No dysuria, vaginal discharge or pain during intercourse  HTN: taking medication, bp at home is better than it is here, no chest pain or palpitation  Goiter: she will go for a thyroid US soon and has follow up with Endo  Perennial Allergic Rhinitis: worse this time of the year, she has noticed nasal congestion, itchy eye, and also a dry cough, worse when she lays down at night, no wheezing or SOB   Patient Active Problem List   Diagnosis Date Noted  . Benign hypertension 06/30/2018  . Goiter 06/30/2018  . Uterine fibroid 06/30/2018  . Perimenopausal symptom 06/30/2018    Past Surgical History:  Procedure Laterality Date  . COLONOSCOPY WITH PROPOFOL N/A 03/25/2018   Procedure: COLONOSCOPY WITH PROPOFOL;  Surgeon: Jonathon Bellows, MD;  Location: [facility] ENDOSCOPY;  Service: Gastroenterology;  Laterality: N/A;  . HAND TENDON SURGERY Right 1985   Pinky Finger  . UTERINE FIBROID EMBOLIZATION      Family History  Problem Relation Age of Onset  . Diabetes Mother   . Hypertension Maternal Grandmother   . Alcohol abuse Maternal Grandmother   . Breast cancer Neg Hx     Social History   Socioeconomic History  . Marital status: Married    Spouse name: Legrand Como  . Number of children: 3  . Years of education: Not on file  . Highest education level: Associate degree: academic program  Occupational History  . Occupation: Therapist, sports  Social Needs  . Financial resource strain: Not hard at all  . Food  insecurity:    Worry: Never true    Inability: Never true  . Transportation needs:    Medical: No    Non-medical: No  Tobacco Use  . Smoking status: Never Smoker  . Smokeless tobacco: Never Used  Substance and Sexual Activity  . Alcohol use: Yes    Comment: occass  . Drug use: No  . Sexual activity: Yes    Partners: Male    Birth control/protection: None  Lifestyle  . Physical activity:    Days per week: 5 days    Minutes per session: 60 min  . Stress: Only a little  Relationships  . Social connections:    Talks on phone: More than three times a week    Gets together: More than three times a week    Attends religious service: More than 4 times per year    Active member of club or organization: No    Attends meetings of clubs or organizations: Never    Relationship status: Married  . Intimate partner violence:    Fear of current or ex partner: No    Emotionally abused: No    Physically abused: No    Forced sexual activity: No  Other Topics Concern  . Not on file  Social History Narrative   She is originally from [place] in 2014 because of her current husband, they got married in 2015  She has 3 grown children still in [place].    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      Current Outpatient Medications:  .  cloNIDine (CATAPRES) 0.1 MG tablet, TAKE 1 TABLET (0.1 MG TOTAL) BY MOUTH AT BEDTIME., Disp: 90 tablet, Rfl: 0 .  Multiple Vitamin (MULTI VITAMIN PO), Take by mouth., Disp: , Rfl:  .  norethindrone (MICRONOR,CAMILA,ERRIN) 0.35 MG tablet, Take 1 tablet (0.35 mg total) by mouth daily., Disp: 1 Package, Rfl: 11 .  valsartan-hydrochlorothiazide (DIOVAN HCT) 320-12.5 MG tablet, Take 1 tablet by mouth daily., Disp: 90 tablet, Rfl: 0 .  Olopatadine HCl 0.2 % SOLN, Place 1 drop into both eyes daily., Disp: , Rfl:  .  PARoxetine (PAXIL) 10 MG tablet, TAKE 1 TABLET DAILY BY MOUTH IN THE EVENING. (Patient not taking:  Reported on 12/30/2018), Disp: 90 tablet, Rfl: 3  No Known Allergies  I personally reviewed active problem list, medication list, allergies, family history, social history with the patient/caregiver today.   ROS  Constitutional: Negative for fever , positive for mild weight change.  Respiratory: Negative for cough and shortness of breath.   Cardiovascular: Negative for chest pain or palpitations.  Gastrointestinal: Positive  for abdominal pain/pelvic intermittent , no bowel changes.  Musculoskeletal: Negative for gait problem or joint swelling.  Skin: Negative for rash.  Neurological: Negative for dizziness or headache.  No other specific complaints in a complete review of systems (except as listed in HPI above).  Objective  Vitals:   12/30/18 0918  BP: 140/80  Pulse: 95  Temp: 98.3 F (36.8 C)  SpO2: 99%  Weight: 182 lb 3.2 oz (82.6 kg)  Height: 5\' 7"  (1.702 m)    Body mass index is 28.54 kg/m.  Physical Exam  Constitutional: Patient appears well-developed and well-nourished. Overweight.  No distress.  HEENT: head atraumatic, normocephalic, pupils equal and reactive to light, boggy turbinates,  neck supple, throat within normal limits Cardiovascular: Normal rate, regular rhythm and normal heart sounds.  No murmur heard. No BLE edema. Pulmonary/Chest: Effort normal and breath sounds normal. No respiratory distress. Abdominal: Soft.  There is no tenderness. Normal bowel sounds.  Muscular skeletal: right lower back pain, normal rom, negative straight leg raise  Psychiatric: Patient has a normal mood and affect. behavior is normal. Judgment and thought content normal.  Recent Results (from the past 2160 hour(s))  POCT Urinalysis Dipstick     Status: Normal   Collection Time: 12/30/18  9:26 AM  Result Value Ref Range   Color, UA yellow    Clarity, UA clear    Glucose, UA Negative Negative   Bilirubin, UA Negative    Ketones, UA Negative    Spec Grav, UA 1.020 1.010 -  1.025   Blood, UA Negative    pH 6.0    Protein, UA Negative Negative   Urobilinogen, UA 0.2 0.2 or 1.0 E.U./dL   Nitrite, UA Negative    Leukocytes, UA Negative Negative   Appearance Normal    Odor normal       PHQ2/9: Depression screen [facility] 2/9 06/30/2018  Decreased Interest 0  Down, Depressed, Hopeless 0  PHQ - 2 Score 0  Altered sleeping 2  Tired, decreased energy 3  Change in appetite 1  Feeling bad or failure about yourself  0  Trouble concentrating 0  Moving slowly or fidgety/restless 0  Suicidal thoughts 0  PHQ-9 Score 6  Difficult doing  work/chores Not difficult at all    phq 9 is negative   Fall Risk: Fall Risk  10/08/2018 06/30/2018  Falls in the past year? 0 No  Number falls in past yr: 0 -  Injury with Fall? 0 -     Assessment & Plan   1. Urinary tract infection symptoms  - POCT Urinalysis Dipstick - CULTURE, URINE COMPREHENSIVE  2. Benign hypertension  - valsartan-hydrochlorothiazide (DIOVAN HCT) 320-12.5 MG tablet; Take 1 tablet by mouth daily.  Dispense: 90 tablet; Refill: 0  3. Perimenopausal symptom  - cloNIDine (CATAPRES) 0.1 MG tablet; Take 1 tablet (0.1 mg total) by mouth at bedtime.  Dispense: 90 tablet; Refill: 0  4. Night sweats  - cloNIDine (CATAPRES) 0.1 MG tablet; Take 1 tablet (0.1 mg total) by mouth at bedtime.  Dispense: 90 tablet; Refill: 0  5. Perennial allergic rhinitis with seasonal variation  - montelukast (SINGULAIR) 10 MG tablet; Take 1 tablet (10 mg total) by mouth at bedtime.  Dispense: 90 tablet; Refill: 0  6. Dry cough  - montelukast (SINGULAIR) 10 MG tablet; Take 1 tablet (10 mg total) by mouth at bedtime.  Dispense: 90 tablet; Refill: 0  7. Goiter  Keep follow up with Endo

## 2019-01-01 LAB — CULTURE, URINE COMPREHENSIVE
MICRO NUMBER:: 367763
SPECIMEN QUALITY:: ADEQUATE

## 2019-01-21 ENCOUNTER — Ambulatory Visit: Payer: BLUE CROSS/BLUE SHIELD | Admitting: Family Medicine

## 2019-02-25 ENCOUNTER — Ambulatory Visit: Payer: BLUE CROSS/BLUE SHIELD | Admitting: General Surgery

## 2019-02-25 ENCOUNTER — Other Ambulatory Visit: Payer: Self-pay

## 2019-02-25 ENCOUNTER — Encounter: Payer: Self-pay | Admitting: Family Medicine

## 2019-03-31 ENCOUNTER — Other Ambulatory Visit: Payer: Self-pay | Admitting: Family Medicine

## 2019-03-31 ENCOUNTER — Other Ambulatory Visit: Payer: Self-pay

## 2019-03-31 DIAGNOSIS — J3089 Other allergic rhinitis: Secondary | ICD-10-CM

## 2019-03-31 DIAGNOSIS — J302 Other seasonal allergic rhinitis: Secondary | ICD-10-CM

## 2019-03-31 DIAGNOSIS — R05 Cough: Secondary | ICD-10-CM

## 2019-03-31 DIAGNOSIS — R058 Other specified cough: Secondary | ICD-10-CM

## 2019-04-01 ENCOUNTER — Other Ambulatory Visit: Payer: Self-pay | Admitting: Obstetrics and Gynecology

## 2019-04-01 NOTE — Telephone Encounter (Signed)
Patient needs appointment prior to next refill.

## 2019-04-02 MED ORDER — NORETHINDRONE 0.35 MG PO TABS: 1.0000 | ORAL_TABLET | Freq: Every day | ORAL | 0 refills | Status: DC

## 2019-04-07 ENCOUNTER — Other Ambulatory Visit: Payer: Self-pay | Admitting: Family Medicine

## 2019-04-07 ENCOUNTER — Encounter: Payer: Self-pay | Admitting: Family Medicine

## 2019-04-08 HISTORY — PX: THYROIDECTOMY, PARTIAL: SHX18

## 2019-04-15 ENCOUNTER — Ambulatory Visit: Payer: BLUE CROSS/BLUE SHIELD | Admitting: Nurse Practitioner

## 2019-04-17 ENCOUNTER — Encounter: Payer: Self-pay | Admitting: Family Medicine

## 2019-04-17 ENCOUNTER — Other Ambulatory Visit: Payer: Self-pay

## 2019-04-17 ENCOUNTER — Ambulatory Visit (INDEPENDENT_AMBULATORY_CARE_PROVIDER_SITE_OTHER): Payer: BLUE CROSS/BLUE SHIELD | Admitting: Family Medicine

## 2019-04-17 VITALS — BP 126/95 | HR 64

## 2019-04-17 DIAGNOSIS — R61 Generalized hyperhidrosis: Secondary | ICD-10-CM | POA: Diagnosis not present

## 2019-04-17 DIAGNOSIS — G4700 Insomnia, unspecified: Secondary | ICD-10-CM

## 2019-04-17 DIAGNOSIS — N951 Menopausal and female climacteric states: Secondary | ICD-10-CM

## 2019-04-17 DIAGNOSIS — I10 Essential (primary) hypertension: Secondary | ICD-10-CM

## 2019-04-17 DIAGNOSIS — R928 Other abnormal and inconclusive findings on diagnostic imaging of breast: Secondary | ICD-10-CM

## 2019-04-17 MED ORDER — VALSARTAN-HYDROCHLOROTHIAZIDE 320-25 MG PO TABS: 1.0000 | ORAL_TABLET | Freq: Every day | ORAL | 0 refills | Status: DC

## 2019-04-17 MED ORDER — TRAZODONE HCL 50 MG PO TABS: 25.0000 mg | ORAL_TABLET | Freq: Every evening | ORAL | 0 refills | Status: DC | PRN

## 2019-04-17 NOTE — Progress Notes (Signed)
Name: Jessica Moore   MRN: [id number]    DOB: 26-Aug-1966   Date:04/17/2019       Progress Note  Subjective  Chief Complaint  Chief Complaint  Patient presents with  . Referral  . Medication Refill    Possible panic attacks Saturday and Sunday-had to get up and walk around due to she was short of breath  . Hypertension    Denies any symptoms-125/90's at home  . Insomnia    Medication is still causing her to have night terrors-combination of medication. Takes the Clonidine in the day for a week and then switched back to taking it at night-gives bad dreams and can't go back to sleep (stopped taking Paxil)  . Goiter    Had surgery to remove lump and just having some healing pain from this surgery-had done at [facility]  . Perimenopause    I connected with  Katha Cabal  on 04/17/19 at 11:00 AM EDT by a video enabled telemedicine application and verified that I am speaking with the correct person using two identifiers.  I discussed the limitations of evaluation and management by telemedicine and the availability of in person appointments. The patient expressed understanding and agreed to proceed. Staff also discussed with the patient that there may be a patient responsible charge related to this service. Patient Location: at home  Provider Location: [place]; she had partial  thyroidectomy done 9 days ago by Dr. Maudie Mercury at [facility], she states wound is only slightly sore, but has pulling when she looks up. No fever or chills  HTN: she states bp was slightly up today because she was rushing, but usually well controlled, no chest pain or palpitation  Insomnia: she stopped taking Paroxetine because it was causing nightmares, she states she has difficulty relaxing at night and feels tired during the day, we will try Trazodone   Weight gain: she has been active and eating healthy but has gaining weight, advised to come in to discuss options if  continues to go up  Possible panic attack:  only happened a few days after surgery, no symptoms since. We will monitor for now , she states felt like something was pushing on her throat during the night, that has resolved   Patient Active Problem List   Diagnosis Date Noted  . Benign hypertension 06/30/2018  . Goiter 06/30/2018  . Uterine fibroid 06/30/2018  . Perimenopausal symptom 06/30/2018    Past Surgical History:  Procedure Laterality Date  . COLONOSCOPY WITH PROPOFOL N/A 03/25/2018   Procedure: COLONOSCOPY WITH PROPOFOL;  Surgeon: Jonathon Bellows, MD;  Location: [facility] ENDOSCOPY;  Service: Gastroenterology;  Laterality: N/A;  . HAND TENDON SURGERY Right 1985   Pinky Finger  . THYROIDECTOMY, PARTIAL Left 04/08/2019   Dr. Purcell Nails  . UTERINE FIBROID EMBOLIZATION      Family History  Problem Relation Age of Onset  . Diabetes Mother   . Hypertension Maternal Grandmother   . Alcohol abuse Maternal Grandmother   . Breast cancer Neg Hx     Social History   Socioeconomic History  . Marital status: Married    Spouse name: Legrand Como  . Number of children: 3  . Years of education: Not on file  . Highest education level: Associate degree: academic program  Occupational History  . Occupation: Therapist, sports    Comment: Not currently working  Social Needs  . Financial resource strain: Not hard at all  . Food insecurity    Worry:  Never true    Inability: Never true  . Transportation needs    Medical: No    Non-medical: No  Tobacco Use  . Smoking status: Never Smoker  . Smokeless tobacco: Never Used  Substance and Sexual Activity  . Alcohol use: Yes    Comment: occass  . Drug use: No  . Sexual activity: Yes    Partners: Male    Birth control/protection: None  Lifestyle  . Physical activity    Days per week: 5 days    Minutes per session: 60 min  . Stress: Only a little  Relationships  . Social connections    Talks on phone: More than three times a week    Gets together: More  than three times a week    Attends religious service: More than 4 times per year    Active member of club or organization: No    Attends meetings of clubs or organizations: Never    Relationship status: Married  . Intimate partner violence    Fear of current or ex partner: No    Emotionally abused: No    Physically abused: No    Forced sexual activity: No  Other Topics Concern  . Not on file  Social History Narrative   She is originally from [place] in 2014 because of her current husband, they got married in 2015   She has 3 grown children still in [place].    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      Current Outpatient Medications:  .  montelukast (SINGULAIR) 10 MG tablet, TAKE 1 TABLET BY MOUTH EVERYDAY AT BEDTIME (Patient taking differently: Take 10 mg by mouth at bedtime. ), Disp: 90 tablet, Rfl: 1 .  Multiple Vitamin (MULTI VITAMIN PO), Take by mouth., Disp: , Rfl:  .  norethindrone (MICRONOR) 0.35 MG tablet, Take 1 tablet (0.35 mg total) by mouth daily., Disp: 84 tablet, Rfl: 0 .  Olopatadine HCl 0.2 % SOLN, Place 1 drop into both eyes daily., Disp: , Rfl:  .  traZODone (DESYREL) 50 MG tablet, Take 0.5-1 tablets (25-50 mg total) by mouth at bedtime as needed for sleep., Disp: 30 tablet, Rfl: 0 .  valsartan-hydrochlorothiazide (DIOVAN HCT) 320-25 MG tablet, Take 1 tablet by mouth daily., Disp: 90 tablet, Rfl: 0  No Known Allergies  I personally reviewed active problem list, medication list, allergies, family history, social history with the patient/caregiver today.   ROS  Ten systems reviewed and is negative except as mentioned in HPI   Objective  Virtual encounter  Vitals:   04/17/19 0841  BP: (!) 126/95  Pulse: 64    There is no height or weight on file to calculate BMI.  Physical Exam  Awake, alert and oriented, steri strips on her neck, incision seems normal   PHQ2/9: Depression screen [facility] 2/9  04/17/2019 06/30/2018  Decreased Interest 0 0  Down, Depressed, Hopeless 0 0  PHQ - 2 Score 0 0  Altered sleeping 3 2  Tired, decreased energy 2 3  Change in appetite 0 1  Feeling bad or failure about yourself  0 0  Trouble concentrating 0 0  Moving slowly or fidgety/restless 0 0  Suicidal thoughts 0 0  PHQ-9 Score 5 6  Difficult doing work/chores Not difficult at all Not difficult at all   PHQ-2/9 Result is negative.    Fall Risk: Fall Risk  04/17/2019 10/08/2018 06/30/2018  Falls in  the past year? 0 0 No  Number falls in past yr: 0 0 -  Injury with Fall? 0 0 -    Assessment & Plan  1. Benign hypertension  - valsartan-hydrochlorothiazide (DIOVAN HCT) 320-25 MG tablet; Take 1 tablet by mouth daily.  Dispense: 90 tablet; Refill: 0  2. Insomnia, unspecified type  - traZODone (DESYREL) 50 MG tablet; Take 0.5-1 tablets (25-50 mg total) by mouth at bedtime as needed for sleep.  Dispense: 30 tablet; Refill: 0  3. Night sweats  She will discuss with Dr. Marcelline Mates  4. Perimenopausal symptom  Stopped clonidine because of nightmares   5. Abnormal mammogram of right breast  - MM DIAG BREAST TOMO BILATERAL; Future - US BREAST LTD UNI RIGHT INC AXILLA; Future  I discussed the assessment and treatment plan with the patient. The patient was provided an opportunity to ask questions and all were answered. The patient agreed with the plan and demonstrated an understanding of the instructions.  The patient was advised to call back or seek an in-person evaluation if the symptoms worsen or if the condition fails to improve as anticipated.  I provided 25  minutes of non-face-to-face time during this encounter.

## 2019-04-21 ENCOUNTER — Other Ambulatory Visit: Payer: Self-pay

## 2019-04-21 DIAGNOSIS — Z20822 Contact with and (suspected) exposure to covid-19: Secondary | ICD-10-CM

## 2019-04-23 ENCOUNTER — Encounter: Payer: Self-pay | Admitting: Family Medicine

## 2019-04-23 DIAGNOSIS — E041 Nontoxic single thyroid nodule: Secondary | ICD-10-CM | POA: Insufficient documentation

## 2019-04-23 LAB — NOVEL CORONAVIRUS, NAA: SARS-CoV-2, NAA: NOT DETECTED

## 2019-05-01 ENCOUNTER — Ambulatory Visit: Payer: BLUE CROSS/BLUE SHIELD | Admitting: Family Medicine

## 2019-05-09 ENCOUNTER — Other Ambulatory Visit: Payer: Self-pay | Admitting: Family Medicine

## 2019-05-09 DIAGNOSIS — G4700 Insomnia, unspecified: Secondary | ICD-10-CM

## 2019-05-10 ENCOUNTER — Other Ambulatory Visit: Payer: Self-pay | Admitting: Family Medicine

## 2019-05-10 DIAGNOSIS — I10 Essential (primary) hypertension: Secondary | ICD-10-CM

## 2019-05-18 ENCOUNTER — Other Ambulatory Visit: Payer: Self-pay

## 2019-05-18 DIAGNOSIS — I10 Essential (primary) hypertension: Secondary | ICD-10-CM

## 2019-05-18 MED ORDER — VALSARTAN-HYDROCHLOROTHIAZIDE 320-25 MG PO TABS: 1.0000 | ORAL_TABLET | Freq: Every day | ORAL | 0 refills | Status: DC

## 2019-06-12 ENCOUNTER — Encounter: Payer: Self-pay | Admitting: *Deleted

## 2019-06-18 ENCOUNTER — Encounter: Payer: Self-pay | Admitting: Family Medicine

## 2019-06-19 ENCOUNTER — Other Ambulatory Visit: Payer: Self-pay | Admitting: Family Medicine

## 2019-06-19 DIAGNOSIS — G4700 Insomnia, unspecified: Secondary | ICD-10-CM

## 2019-06-19 MED ORDER — TRAZODONE HCL 50 MG PO TABS: 25.0000 mg | ORAL_TABLET | Freq: Every evening | ORAL | 0 refills | Status: DC | PRN

## 2019-06-20 ENCOUNTER — Other Ambulatory Visit: Payer: Self-pay | Admitting: Obstetrics and Gynecology

## 2019-06-22 ENCOUNTER — Other Ambulatory Visit: Payer: Self-pay | Admitting: Family Medicine

## 2019-06-22 DIAGNOSIS — G4700 Insomnia, unspecified: Secondary | ICD-10-CM

## 2019-06-27 ENCOUNTER — Other Ambulatory Visit: Payer: Self-pay | Admitting: Family Medicine

## 2019-06-27 DIAGNOSIS — R61 Generalized hyperhidrosis: Secondary | ICD-10-CM

## 2019-06-27 DIAGNOSIS — N951 Menopausal and female climacteric states: Secondary | ICD-10-CM

## 2019-07-14 ENCOUNTER — Encounter: Payer: BLUE CROSS/BLUE SHIELD | Admitting: Obstetrics and Gynecology

## 2019-07-20 ENCOUNTER — Ambulatory Visit: Payer: BLUE CROSS/BLUE SHIELD | Admitting: Family Medicine

## 2019-07-20 ENCOUNTER — Other Ambulatory Visit: Payer: BLUE CROSS/BLUE SHIELD

## 2019-07-25 ENCOUNTER — Other Ambulatory Visit: Payer: Self-pay | Admitting: Family Medicine

## 2019-07-25 DIAGNOSIS — N951 Menopausal and female climacteric states: Secondary | ICD-10-CM

## 2019-07-25 DIAGNOSIS — R61 Generalized hyperhidrosis: Secondary | ICD-10-CM

## 2019-08-04 ENCOUNTER — Encounter: Payer: BLUE CROSS/BLUE SHIELD | Admitting: Obstetrics and Gynecology

## 2019-09-16 ENCOUNTER — Other Ambulatory Visit: Payer: Self-pay | Admitting: Family Medicine

## 2019-09-16 DIAGNOSIS — R05 Cough: Secondary | ICD-10-CM

## 2019-09-16 DIAGNOSIS — R058 Other specified cough: Secondary | ICD-10-CM

## 2019-09-16 DIAGNOSIS — J3089 Other allergic rhinitis: Secondary | ICD-10-CM

## 2019-09-16 DIAGNOSIS — J302 Other seasonal allergic rhinitis: Secondary | ICD-10-CM

## 2019-09-18 ENCOUNTER — Ambulatory Visit (INDEPENDENT_AMBULATORY_CARE_PROVIDER_SITE_OTHER): Payer: BLUE CROSS/BLUE SHIELD | Admitting: Family Medicine

## 2019-09-18 ENCOUNTER — Ambulatory Visit
Admission: RE | Admit: 2019-09-18 | Discharge: 2019-09-18 | Disposition: A | Discharge status: Home / Self Care | Payer: BLUE CROSS/BLUE SHIELD | Source: Ambulatory Visit | Attending: Family Medicine | Admitting: Family Medicine

## 2019-09-18 ENCOUNTER — Encounter: Payer: Self-pay | Admitting: Family Medicine

## 2019-09-18 ENCOUNTER — Other Ambulatory Visit: Payer: Self-pay

## 2019-09-18 VITALS — BP 128/84 | HR 69 | Temp 97.3°F | Resp 16 | Ht 67.0 in | Wt 186.3 lb

## 2019-09-18 DIAGNOSIS — R928 Other abnormal and inconclusive findings on diagnostic imaging of breast: Secondary | ICD-10-CM

## 2019-09-18 DIAGNOSIS — J3089 Other allergic rhinitis: Secondary | ICD-10-CM | POA: Diagnosis not present

## 2019-09-18 DIAGNOSIS — J302 Other seasonal allergic rhinitis: Secondary | ICD-10-CM

## 2019-09-18 DIAGNOSIS — R7303 Prediabetes: Secondary | ICD-10-CM

## 2019-09-18 DIAGNOSIS — Z9009 Acquired absence of other part of head and neck: Secondary | ICD-10-CM

## 2019-09-18 DIAGNOSIS — E663 Overweight: Secondary | ICD-10-CM

## 2019-09-18 DIAGNOSIS — I10 Essential (primary) hypertension: Secondary | ICD-10-CM | POA: Diagnosis not present

## 2019-09-18 DIAGNOSIS — R05 Cough: Secondary | ICD-10-CM | POA: Diagnosis not present

## 2019-09-18 DIAGNOSIS — Z13220 Encounter for screening for lipoid disorders: Secondary | ICD-10-CM

## 2019-09-18 DIAGNOSIS — R058 Other specified cough: Secondary | ICD-10-CM

## 2019-09-18 DIAGNOSIS — E890 Postprocedural hypothyroidism: Secondary | ICD-10-CM

## 2019-09-18 DIAGNOSIS — G4700 Insomnia, unspecified: Secondary | ICD-10-CM | POA: Diagnosis not present

## 2019-09-18 DIAGNOSIS — R739 Hyperglycemia, unspecified: Secondary | ICD-10-CM

## 2019-09-18 MED ORDER — TRAZODONE HCL 50 MG PO TABS: 25.0000 mg | ORAL_TABLET | Freq: Every evening | ORAL | 1 refills | Status: DC | PRN

## 2019-09-18 MED ORDER — VALSARTAN-HYDROCHLOROTHIAZIDE 320-25 MG PO TABS: 1.0000 | ORAL_TABLET | Freq: Every day | ORAL | 1 refills | Status: DC

## 2019-09-18 MED ORDER — METFORMIN HCL ER 500 MG PO TB24: 500.0000 mg | ORAL_TABLET | Freq: Every day | ORAL | 1 refills | Status: DC

## 2019-09-18 MED ORDER — MONTELUKAST SODIUM 10 MG PO TABS: 10.0000 mg | ORAL_TABLET | Freq: Every day | ORAL | 1 refills | Status: DC

## 2019-09-18 NOTE — Progress Notes (Signed)
Name: Jessica Moore   MRN: [id number]    DOB: [birth year]   Date:09/18/2019       Progress Note  Subjective  Chief Complaint  Chief Complaint  Patient presents with  . Hypertension  . Weight Loss    discuss options    HPI   HTN: taking medication, bp at home is better than it is here, no chest pain or palpitation. She has been compliant with medication and needs a refill  Goiter: history of partial thyroidectomy July 2020, initially she has dysphagia, and irritation, but doing well now. We will recheck TSH   Perennial Allergic Rhinitis: she is taking singulair, cough has improved she coughs a little in am from post-nasal drainage, no nasal congestion or rhinorrhea  Hyperglycemia: she is also upset about her weight, discussed recheck labs, discussed carbohydrate restrictive diet , also discussed metformin and she is willing to try it    Patient Active Problem List   Diagnosis Date Noted  . Thyroid nodule 04/23/2019  . Benign hypertension 06/30/2018  . Goiter 06/30/2018  . Uterine fibroid 06/30/2018  . Perimenopausal symptom 06/30/2018    Past Surgical History:  Procedure Laterality Date  . COLONOSCOPY WITH PROPOFOL N/A 03/25/2018   Procedure: COLONOSCOPY WITH PROPOFOL;  Surgeon: Jonathon Bellows, MD;  Location[facility] ENDOSCOPY;  Service: Gastroenterology;  Laterality: N/A;  . HAND TENDON SURGERY Right 1985   Pinky Finger  . THYROIDECTOMY, PARTIAL Left 04/08/2019   Dr. Purcell Nails  . UTERINE FIBROID EMBOLIZATION      Family History  Problem Relation Age of Onset  . Diabetes Mother   . Hypertension Maternal Grandmother   . Alcohol abuse Maternal Grandmother   . Breast cancer Neg Hx     Social History   Socioeconomic History  . Marital status: Married    Spouse name: Legrand Como  . Number of children: 3  . Years of education: Not on file  . Highest education level: Associate degree: academic program  Occupational History  . 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 Smoking status: Never Smoker  . Smokeless tobacco: Never Used  Substance and Sexual Activity  . Alcohol use: Yes    Comment: occass  . Drug use: No  . Sexual activity: Yes    Partners: Male    Birth control/protection: None  Other Topics Concern  . Not on file  Social History Narrative   She is originally from [place] in 2014 because of her current husband, they got married in 2015   She has 3 grown children still in [place].    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 Difficulty of Paying Living Expenses: Not hard at all  Food Insecurity: No Food Insecurity  . Worried About Charity fundraiser in the Last Year: Never true  . Ran Out of Food in the Last Year: Never true  Transportation Needs: No Transportation Needs  . Lack of Transportation (Medical): No  . Lack of Transportation (Non-Medical): No  Physical Activity: Sufficiently Active  . Days of Exercise per Week: 5 days  . Minutes of Exercise per Session: 60 min  Stress: No Stress Concern Present  . Feeling of Stress : Not at all  Social Connections: Not Isolated  .  Frequency of Communication with Friends and Family: More than three times a week  . Frequency of Social Gatherings with Friends and Family: More than three times a week  . Attends Religious Services: More than 4 times per year  . Active Member of Clubs or Organizations: Yes  . Attends Archivist Meetings: More than 4 times per year  . Marital Status: Married  Human resources officer Violence: Not At Risk  . Fear of Current or Ex-Partner: No  . Emotionally Abused: No  . Physically Abused: No  . Sexually Abused: No     Current Outpatient Medications:  .  montelukast (SINGULAIR) 10 MG tablet, Take 1 tablet (10 mg total) by  mouth at bedtime., Disp: 90 tablet, Rfl: 1 .  Multiple Vitamin (MULTI VITAMIN PO), Take by mouth., Disp: , Rfl:  .  norethindrone (MICRONOR) 0.35 MG tablet, TAKE 1 TABLET BY MOUTH EVERY DAY, Disp: 84 tablet, Rfl: 0 .  traZODone (DESYREL) 50 MG tablet, Take 0.5-1 tablets (25-50 mg total) by mouth at bedtime as needed for sleep., Disp: 90 tablet, Rfl: 1 .  Turmeric (QC TUMERIC COMPLEX PO), Take by mouth., Disp: , Rfl:  .  valsartan-hydrochlorothiazide (DIOVAN HCT) 320-25 MG tablet, Take 1 tablet by mouth daily., Disp: 90 tablet, Rfl: 1 .  Olopatadine HCl 0.2 % SOLN, Place 1 drop into both eyes daily., Disp: , Rfl:   No Known Allergies  I personally reviewed active problem list, medication list, allergies, family history, social history with the patient/caregiver today.   ROS  Constitutional: Negative for fever or weight change.  Respiratory: Negative for cough and shortness of breath.   Cardiovascular: Negative for chest pain or palpitations.  Gastrointestinal: Negative for abdominal pain, no bowel changes.  Musculoskeletal: Negative for gait problem or joint swelling.  Skin: Negative for rash.  Neurological: Negative for dizziness or headache.  No other specific complaints in a complete review of systems (except as listed in HPI above).  Objective  Vitals:   09/18/19 1054  BP: 128/84  Pulse: 69  Resp: 16  Temp: (!) 97.3 F (36.3 C)  TempSrc: Temporal  SpO2: 94%  Weight: 186 lb 4.8 oz (84.5 kg)  Height: 5\' 7"  (1.702 m)    Body mass index is 29.18 kg/m.  Physical Exam  Constitutional: Patient appears well-developed and well-nourished. Overweight.  No distress.  HEENT: head atraumatic, normocephalic, pupils equal and reactive to light, well healed scar from thyroidectomy  Cardiovascular: Normal rate, regular rhythm and normal heart sounds.  No murmur heard. No BLE edema. Pulmonary/Chest: Effort normal and breath sounds normal. No respiratory distress. Abdominal: Soft.   There is no tenderness. Psychiatric: Patient has a normal mood and affect. behavior is normal. Judgment and thought content normal.  PHQ2/9: Depression screen [facility] 2/9 09/18/2019 04/17/2019 06/30/2018  Decreased Interest 0 0 0  Down, Depressed, Hopeless 0 0 0  PHQ - 2 Score 0 0 0  Altered sleeping 1 3 2   Tired, decreased energy 1 2 3   Change in appetite 0 0 1  Feeling bad or failure about yourself  0 0 0  Trouble concentrating 0 0 0  Moving slowly or fidgety/restless 0 0 0  Suicidal thoughts - 0 0  PHQ-9 Score 2 5 6   Difficult doing work/chores Somewhat difficult Not difficult at all Not difficult at all    phq 9 is negative   Fall Risk: Fall Risk  09/18/2019 04/17/2019 10/08/2018 06/30/2018  Falls in the past year? 0 0 0 No  Number falls in past yr: 0 0 0 -  Injury with Fall? 0 0 0 -  Follow up Falls evaluation completed - - -      Assessment & Plan  1. Benign hypertension  - valsartan-hydrochlorothiazide (DIOVAN HCT) 320-25 MG tablet; Take 1 tablet by mouth daily.  Dispense: 90 tablet; Refill: 1 - COMPLETE METABOLIC PANEL WITH GFR - CBC with Differential/Platelet  2. Insomnia, unspecified type  - traZODone (DESYREL) 50 MG tablet; Take 0.5-1 tablets (25-50 mg total) by mouth at bedtime as needed for sleep.  Dispense: 90 tablet; Refill: 1  3. Perennial allergic rhinitis with seasonal variation  - montelukast (SINGULAIR) 10 MG tablet; Take 1 tablet (10 mg total) by mouth at bedtime.  Dispense: 90 tablet; Refill: 1  4. Overweight (BMI 25.0-29.9)  Discussed with the patient the risk posed by an increased BMI. Discussed importance of portion control, calorie counting and at least 150 minutes of physical activity weekly. Avoid sweet beverages and drink more water. Eat at least 6 servings of fruit and vegetables daily   5. Lipid screening  - Lipid panel  6. Hyperglycemia  - Hemoglobin A1c  7. Pre-diabetes  - Hemoglobin A1c - metFORMIN (GLUCOPHAGE-XR) 500 MG 24 hr  tablet; Take 1 tablet (500 mg total) by mouth daily with breakfast.  Dispense: 90 tablet; Refill: 1  8. History of partial thyroidectomy  - TSH

## 2019-09-19 LAB — CBC WITH DIFFERENTIAL/PLATELET
Absolute Monocytes: 259 cells/uL (ref 200–950)
Basophils Absolute: 50 cells/uL (ref 0–200)
Basophils Relative: 0.9 %
Eosinophils Absolute: 160 cells/uL (ref 15–500)
Eosinophils Relative: 2.9 %
HCT: 38.3 % (ref 35.0–45.0)
Hemoglobin: 12.7 g/dL (ref 11.7–15.5)
Lymphs Abs: 1826 cells/uL (ref 850–3900)
MCH: 28.8 pg (ref 27.0–33.0)
MCHC: 33.2 g/dL (ref 32.0–36.0)
MCV: 86.8 fL (ref 80.0–100.0)
MPV: 10.4 fL (ref 7.5–12.5)
Monocytes Relative: 4.7 %
Neutro Abs: 3207 cells/uL (ref 1500–7800)
Neutrophils Relative %: 58.3 %
Platelets: 277 10*3/uL (ref 140–400)
RBC: 4.41 10*6/uL (ref 3.80–5.10)
RDW: 12.9 % (ref 11.0–15.0)
Total Lymphocyte: 33.2 %
WBC: 5.5 10*3/uL (ref 3.8–10.8)

## 2019-09-19 LAB — COMPLETE METABOLIC PANEL WITH GFR
AG Ratio: 1.6 (calc) (ref 1.0–2.5)
ALT: 17 U/L (ref 6–29)
AST: 21 U/L (ref 10–35)
Albumin: 4.4 g/dL (ref 3.6–5.1)
Alkaline phosphatase (APISO): 98 U/L (ref 37–153)
BUN: 17 mg/dL (ref 7–25)
CO2: 26 mmol/L (ref 20–32)
Calcium: 9.9 mg/dL (ref 8.6–10.4)
Chloride: 103 mmol/L (ref 98–110)
Creat: 0.86 mg/dL (ref 0.50–1.05)
GFR, Est African American: 88 mL/min/{1.73_m2} (ref >=60)
GFR, Est Non African American: 76 mL/min/{1.73_m2} (ref >=60)
Globulin: 2.7 g/dL (calc) (ref 1.9–3.7)
Glucose, Bld: 100 mg/dL — ABNORMAL HIGH (ref 65–99)
Potassium: 4.3 mmol/L (ref 3.5–5.3)
Sodium: 140 mmol/L (ref 135–146)
Total Bilirubin: 0.4 mg/dL (ref 0.2–1.2)
Total Protein: 7.1 g/dL (ref 6.1–8.1)

## 2019-09-19 LAB — LIPID PANEL
Cholesterol: 222 mg/dL — ABNORMAL HIGH (ref <200)
HDL: 49 mg/dL — ABNORMAL LOW (ref >=50)
LDL Cholesterol (Calc): 142 mg/dL (calc) — ABNORMAL HIGH
Non-HDL Cholesterol (Calc): 173 mg/dL (calc) — ABNORMAL HIGH (ref <130)
Total CHOL/HDL Ratio: 4.5 (calc) (ref <5.0)
Triglycerides: 172 mg/dL — ABNORMAL HIGH (ref <150)

## 2019-09-19 LAB — TSH: TSH: 2.37 mIU/L

## 2019-09-19 LAB — HEMOGLOBIN A1C
Hgb A1c MFr Bld: 5.7 % of total Hgb — ABNORMAL HIGH (ref <5.7)
Mean Plasma Glucose: 117 (calc)
eAG (mmol/L): 6.5 (calc)

## 2019-09-21 ENCOUNTER — Ambulatory Visit: Payer: BLUE CROSS/BLUE SHIELD | Admitting: Family Medicine

## 2019-09-21 ENCOUNTER — Other Ambulatory Visit: Payer: BLUE CROSS/BLUE SHIELD

## 2019-10-15 ENCOUNTER — Telehealth: Payer: Self-pay

## 2019-10-15 NOTE — Telephone Encounter (Signed)
Patient has questions abt results. Pls call

## 2019-10-16 NOTE — Telephone Encounter (Signed)
Pt called and stated that she did not call the office about test results but about cancelling her appointment due to [facility] not being covered by her insurance anymore.

## 2019-10-20 ENCOUNTER — Encounter: Payer: BLUE CROSS/BLUE SHIELD | Admitting: Obstetrics and Gynecology

## 2019-12-09 LAB — HM PAP SMEAR: HM Pap smear: NORMAL

## 2020-03-11 ENCOUNTER — Other Ambulatory Visit: Payer: Self-pay | Admitting: Family Medicine

## 2020-03-11 DIAGNOSIS — I10 Essential (primary) hypertension: Secondary | ICD-10-CM

## 2020-03-11 DIAGNOSIS — R7303 Prediabetes: Secondary | ICD-10-CM

## 2020-06-05 ENCOUNTER — Other Ambulatory Visit: Payer: Self-pay | Admitting: Family Medicine

## 2020-06-05 DIAGNOSIS — R7303 Prediabetes: Secondary | ICD-10-CM

## 2020-06-05 DIAGNOSIS — I10 Essential (primary) hypertension: Secondary | ICD-10-CM

## 2020-06-05 NOTE — Telephone Encounter (Signed)
Requested medication (s) are due for refill today: yes  Requested medication (s) are on the active medication list: yes  Last refill: 03/12/2020  Future visit scheduled: no  Notes to clinic: overdue for follow up appointment    Requested Prescriptions  Pending Prescriptions Disp Refills   valsartan-hydrochlorothiazide (DIOVAN-HCT) 320-25 MG tablet [Pharmacy Med Name: VALSARTAN-HCTZ 320-25 MG TAB] 90 tablet 0    Sig: TAKE 1 TABLET BY MOUTH EVERY DAY      Cardiovascular: ARB + Diuretic Combos Failed - 06/05/2020 12:49 AM      Failed - K in normal range and within 180 days    Potassium  Date Value Ref Range Status  09/18/2019 4.3 3.5 - 5.3 mmol/L Final          Failed - Na in normal range and within 180 days    Sodium  Date Value Ref Range Status  09/18/2019 140 135 - 146 mmol/L Final  03/05/2018 140 134 - 144 mmol/L Final          Failed - Cr in normal range and within 180 days    Creat  Date Value Ref Range Status  09/18/2019 0.86 0.50 - 1.05 mg/dL Final    Comment:    For patients >82 years of age, the reference limit for Creatinine is approximately 13% higher for people identified as African-American. .           Failed - Ca in normal range and within 180 days    Calcium  Date Value Ref Range Status  09/18/2019 9.9 8.6 - 10.4 mg/dL Final          Failed - Valid encounter within last 6 months    Recent Outpatient Visits           8 months ago Benign hypertension   [facility] [place], Drue Stager, MD   1 year ago Benign hypertension   [facility] [place], Drue Stager, MD   1 year ago Urinary tract infection symptoms   [facility] [place] Sizer, [place]   1 year ago Benign hypertension   [place], [place]   1 year ago Benign hypertension   [facility] [place], Drue Stager, MD              Passed - Patient is not pregnant      Passed - Last BP in  normal range    BP Readings from Last 1 Encounters:  09/18/19 128/84            metFORMIN (GLUCOPHAGE-XR) 500 MG 24 hr tablet [Pharmacy Med Name: METFORMIN HCL ER 500 MG TABLET] 90 tablet 0    Sig: TAKE 1 TABLET BY MOUTH EVERY DAY WITH BREAKFAST      Endocrinology:  Diabetes - Biguanides Failed - 06/05/2020 12:49 AM      Failed - HBA1C is between 0 and 7.9 and within 180 days    Hgb A1c MFr Bld  Date Value Ref Range Status  09/18/2019 5.7 (H) <5.7 % of total Hgb Final    Comment:    For someone without known diabetes, a hemoglobin  A1c value between 5.7% and 6.4% is consistent with prediabetes and should be confirmed with a  follow-up test. . For someone with known diabetes, a value <7% indicates that their diabetes is well controlled. A1c targets should be individualized based on duration of diabetes, age, comorbid conditions, and other considerations. . This assay result is consistent  with an increased risk of diabetes. . Currently, no consensus exists regarding use of hemoglobin A1c for diagnosis of diabetes for children. .           Failed - Valid encounter within last 6 months    Recent Outpatient Visits           8 months ago Benign hypertension   [facility] [place] Sizer, [place]   1 year ago Benign hypertension   [facility] S[facility], [place]   1 year ago Urinary tract infection symptoms   [facility] Sizer, [place]   1 year ago Benign hypertension   [facility] [place], [place], [place]   1 year ago Benign hypertension   [facility] [place], Drue Stager, MD              Passed - Cr in normal range and within 360 days    Creat  Date Value Ref Range Status  09/18/2019 0.86 0.50 - 1.05 mg/dL Final    Comment:    For patients >4 years of age, the reference limit for Creatinine is approximately 13% higher for people identified as African-American. .            Passed - eGFR in normal range and within 360 days    GFR, Est African American  Date Value Ref Range Status  09/18/2019 88 > OR = 60 mL/min/1.59m Final   GFR, Est Non African American  Date Value Ref Range Status  09/18/2019 76 > OR = 60 mL/min/1.774mFinal

## 2020-06-07 ENCOUNTER — Other Ambulatory Visit: Payer: Self-pay

## 2020-06-07 DIAGNOSIS — R7303 Prediabetes: Secondary | ICD-10-CM

## 2021-05-23 ENCOUNTER — Other Ambulatory Visit: Payer: Self-pay | Admitting: Internal Medicine

## 2021-05-23 DIAGNOSIS — N6310 Unspecified lump in the right breast, unspecified quadrant: Secondary | ICD-10-CM

## 2021-05-29 ENCOUNTER — Other Ambulatory Visit: Payer: Self-pay | Admitting: Internal Medicine

## 2021-05-29 DIAGNOSIS — N6310 Unspecified lump in the right breast, unspecified quadrant: Secondary | ICD-10-CM

## 2021-06-29 ENCOUNTER — Ambulatory Visit
Admission: RE | Admit: 2021-06-29 | Discharge: 2021-06-29 | Disposition: A | Discharge status: Home / Self Care | Payer: BLUE CROSS/BLUE SHIELD | Source: Ambulatory Visit | Attending: Internal Medicine | Admitting: Internal Medicine

## 2021-06-29 ENCOUNTER — Other Ambulatory Visit: Payer: Self-pay

## 2021-06-29 DIAGNOSIS — N6310 Unspecified lump in the right breast, unspecified quadrant: Secondary | ICD-10-CM | POA: Diagnosis not present

## 2021-07-03 ENCOUNTER — Other Ambulatory Visit: Payer: Self-pay | Admitting: Internal Medicine

## 2021-07-03 DIAGNOSIS — N6310 Unspecified lump in the right breast, unspecified quadrant: Secondary | ICD-10-CM

## 2021-07-10 ENCOUNTER — Encounter: Payer: Self-pay | Admitting: General Surgery

## 2021-11-01 NOTE — Progress Notes (Signed)
Name: Jessica Moore   MRN: [id number]    DOB: 1966/01/19   Date:11/02/2021       Progress Note  Subjective  Chief Complaint  Medication Refill  HPI  HTN: bp is at goal, no chest pain, palpitation or sob. She needs refills of medication   Grieving: her 58 yo son died Spring 2022, she has been seeing a therapist, also taking Effexor and occasional BZD. She is still struggling but able to get up , showed and watches her God-daughter, started yoga classes last week and going for walks. Denies suicidal thoughts or ideation   Goiter: history of partial thyroidectomy July 2020, last TSH was at goal, denies dysphagia but has episodes that she feels like she is chocking    Perennial Allergic Rhinitis: she is taking singulair and zyrtec 10 mg daily, she has coughing spells when talking . She also has a nasal spray at home. Denies wheezing at this time, but when she has an URI. No nasal congestion or rhinorrhea.   Pre-diabetes: A1C trending up at 6%, she denies polyphagia, polydipsia or polyuria.   Dyslipidemia: high LDL twice last year, now on Pravastatin, denies myalgias. We will recheck labs  The 10-year ASCVD risk score (Arnett DK, et al., 2019) is: 6.8%   Values used to calculate the score:     Age: 48 years     Sex: Female     Is Non-Hispanic African American: Yes     Diabetic: No     Tobacco smoker: No     Systolic Blood Pressure: 836 mmHg     Is BP treated: Yes     HDL Cholesterol: 49 mg/dL     Total Cholesterol: 222 mg/dL   Patient Active Problem List   Diagnosis Date Noted   Thyroid nodule 04/23/2019   Benign hypertension 06/30/2018   Goiter 06/30/2018   Uterine fibroid 06/30/2018   Perimenopausal symptom 06/30/2018    Past Surgical History:  Procedure Laterality Date   BREAST BIOPSY     COLONOSCOPY WITH PROPOFOL N/A 03/25/2018   Procedure: COLONOSCOPY WITH PROPOFOL;  Surgeon: Jonathon Bellows, MD;  Location: [facility] ENDOSCOPY;  Service: Gastroenterology;  Laterality:  N/A;   HAND TENDON SURGERY Right 1985   Pinky Finger   THYROIDECTOMY, PARTIAL Left 04/08/2019   Dr. Purcell Nails   UTERINE FIBROID EMBOLIZATION      Family History  Problem Relation Age of Onset   Diabetes Mother    Obesity Mother    Hypertension Maternal Grandmother    Alcohol abuse Maternal Grandmother    Obesity Son    Stroke Son    Heart failure Son    Breast cancer Neg Hx     Social History   Tobacco Use   Smoking status: Never   Smokeless tobacco: Never  Substance Use Topics   Alcohol use: Yes    Comment: occass     Current Outpatient Medications:    amLODipine (NORVASC) 5 MG tablet, Take 5 mg by mouth daily., Disp: , Rfl:    BIOTIN PO, Take by mouth., Disp: , Rfl:    celecoxib (CELEBREX) 200 MG capsule, Take 200 mg by mouth daily., Disp: , Rfl:    cetirizine (ZYRTEC) 10 MG tablet, Take 10 mg by mouth daily., Disp: , Rfl:    Cholecalciferol-Vitamin C (VITAMIN D3-VITAMIN C PO), Take by mouth., Disp: , Rfl:    clonazePAM (KLONOPIN) 0.5 MG tablet, Take 0.5 mg by mouth 2 (two) times daily as needed for anxiety., Disp: , Rfl:  Multiple Vitamin (MULTI VITAMIN PO), Take by mouth., Disp: , Rfl:    pravastatin (PRAVACHOL) 40 MG tablet, Take 40 mg by mouth daily., Disp: , Rfl:    Turmeric (QC TUMERIC COMPLEX PO), Take by mouth., Disp: , Rfl:    valsartan-hydrochlorothiazide (DIOVAN-HCT) 320-25 MG tablet, TAKE 1 TABLET BY MOUTH EVERY DAY, Disp: 90 tablet, Rfl: 0   venlafaxine XR (EFFEXOR-XR) 150 MG 24 hr capsule, Take 150 mg by mouth daily with breakfast., Disp: , Rfl:    metFORMIN (GLUCOPHAGE-XR) 500 MG 24 hr tablet, TAKE 1 TABLET BY MOUTH EVERY DAY WITH BREAKFAST (Patient not taking: Reported on 11/02/2021), Disp: 90 tablet, Rfl: 0   montelukast (SINGULAIR) 10 MG tablet, Take 1 tablet (10 mg total) by mouth at bedtime. (Patient not taking: Reported on 11/02/2021), Disp: 90 tablet, Rfl: 1   norethindrone (MICRONOR) 0.35 MG tablet, TAKE 1 TABLET BY MOUTH EVERY DAY (Patient not  taking: Reported on 11/02/2021), Disp: 84 tablet, Rfl: 0   Olopatadine HCl 0.2 % SOLN, Place 1 drop into both eyes daily. (Patient not taking: Reported on 11/02/2021), Disp: , Rfl:    traZODone (DESYREL) 50 MG tablet, Take 0.5-1 tablets (25-50 mg total) by mouth at bedtime as needed for sleep. (Patient not taking: Reported on 11/02/2021), Disp: 90 tablet, Rfl: 1  No Known Allergies  I personally reviewed active problem list, medication list, allergies, family history, social history, health maintenance with the patient/caregiver today.   ROS  Constitutional: Negative for fever or weight change.  Respiratory: Negative for cough and shortness of breath.   Cardiovascular: Negative for chest pain or palpitations.  Gastrointestinal: Negative for abdominal pain, no bowel changes.  Musculoskeletal: Negative for gait problem or joint swelling.  Skin: Negative for rash.  Neurological: Negative for dizziness or headache.  No other specific complaints in a complete review of systems (except as listed in HPI above).   Objective  Vitals:   11/02/21 1041  BP: 128/80  Pulse: 93  Resp: 16  SpO2: 99%  Weight: 184 lb (83.5 kg)  Height: 5\' 7"  (1.702 m)    Body mass index is 28.82 kg/m.  Physical Exam  Constitutional: Patient appears well-developed and well-nourished.  No distress.  HEENT: head atraumatic, normocephalic, pupils equal and reactive to light,  neck supple Cardiovascular: Normal rate, regular rhythm and normal heart sounds.  No murmur heard. No BLE edema. Pulmonary/Chest: Effort normal and breath sounds normal. No respiratory distress. Abdominal: Soft.  There is no tenderness. Psychiatric: Patient has a normal mood and affect. behavior is normal. Judgment and thought content normal.    PHQ2/9: Depression screen [facility] 2/9 11/02/2021 09/18/2019 04/17/2019 06/30/2018  Decreased Interest 1 0 0 0  Down, Depressed, Hopeless 1 0 0 0  PHQ - 2 Score 2 0 0 0  Altered sleeping 3 1 3 2   Tired,  decreased energy 3 1 2 3   Change in appetite 1 0 0 1  Feeling bad or failure about yourself  0 0 0 0  Trouble concentrating 1 0 0 0  Moving slowly or fidgety/restless 0 0 0 0  Suicidal thoughts 0 - 0 0  PHQ-9 Score 10 2 5 6   Difficult doing work/chores - Somewhat difficult Not difficult at all Not difficult at all    phq 9 is positive   Fall Risk: Fall Risk  11/02/2021 09/18/2019 04/17/2019 10/08/2018 06/30/2018  Falls in the past year? 0 0 0 0 No  Number falls in past yr: 0 0 0 0 -  Injury with Fall? 0 0 0 0 -  Risk for fall due to : No Fall Risks - - - -  Follow up Falls prevention discussed Falls evaluation completed - - -      Functional Status Survey: Is the patient deaf or have difficulty hearing?: No Does the patient have difficulty seeing, even when wearing glasses/contacts?: No Does the patient have difficulty concentrating, remembering, or making decisions?: Yes Does the patient have difficulty walking or climbing stairs?: Yes Does the patient have difficulty dressing or bathing?: No Does the patient have difficulty doing errands alone such as visiting a doctor's office or shopping?: No    Assessment & Plan   1. Benign hypertension  - valsartan-hydrochlorothiazide (DIOVAN-HCT) 320-25 MG tablet; Take 1 tablet by mouth daily.  Dispense: 90 tablet; Refill: 1 - amLODipine (NORVASC) 5 MG tablet; Take 1 tablet (5 mg total) by mouth daily.  Dispense: 90 tablet; Refill: 1  2. Goiter   3. Perennial allergic rhinitis with seasonal variation   4. Dysfunctional grieving  - venlafaxine XR (EFFEXOR-XR) 150 MG 24 hr capsule; Take 1 capsule (150 mg total) by mouth daily with breakfast.  Dispense: 90 capsule; Refill: 1  5. Need for shingles vaccine  Advised to check coverage with insurance  6. Dyslipidemia  - pravastatin (PRAVACHOL) 40 MG tablet; Take 1 tablet (40 mg total) by mouth daily.  Dispense: 90 tablet; Refill: 1 - Lipid panel  7. Abnormal mammogram of right  breast  - MM Digital Diagnostic Unilat R; Future - US BREAST LTD UNI RIGHT INC AXILLA; Future  8. Chronic pain of left knee  She states x-ray showed OA, advised to only take tylenol prn   9. Long-term use of high-risk medication  - Hepatic function panel

## 2021-11-02 ENCOUNTER — Ambulatory Visit (INDEPENDENT_AMBULATORY_CARE_PROVIDER_SITE_OTHER): Payer: 59 | Admitting: Family Medicine

## 2021-11-02 ENCOUNTER — Other Ambulatory Visit: Payer: Self-pay | Admitting: Family Medicine

## 2021-11-02 ENCOUNTER — Other Ambulatory Visit: Payer: Self-pay | Admitting: Internal Medicine

## 2021-11-02 ENCOUNTER — Encounter: Payer: Self-pay | Admitting: Family Medicine

## 2021-11-02 VITALS — BP 128/80 | HR 93 | Resp 16 | Ht 67.0 in | Wt 184.0 lb

## 2021-11-02 DIAGNOSIS — J3089 Other allergic rhinitis: Secondary | ICD-10-CM

## 2021-11-02 DIAGNOSIS — E049 Nontoxic goiter, unspecified: Secondary | ICD-10-CM | POA: Diagnosis not present

## 2021-11-02 DIAGNOSIS — M25562 Pain in left knee: Secondary | ICD-10-CM

## 2021-11-02 DIAGNOSIS — R928 Other abnormal and inconclusive findings on diagnostic imaging of breast: Secondary | ICD-10-CM

## 2021-11-02 DIAGNOSIS — N63 Unspecified lump in unspecified breast: Secondary | ICD-10-CM

## 2021-11-02 DIAGNOSIS — Z23 Encounter for immunization: Secondary | ICD-10-CM

## 2021-11-02 DIAGNOSIS — J302 Other seasonal allergic rhinitis: Secondary | ICD-10-CM | POA: Insufficient documentation

## 2021-11-02 DIAGNOSIS — E785 Hyperlipidemia, unspecified: Secondary | ICD-10-CM | POA: Insufficient documentation

## 2021-11-02 DIAGNOSIS — G8929 Other chronic pain: Secondary | ICD-10-CM | POA: Insufficient documentation

## 2021-11-02 DIAGNOSIS — F4321 Adjustment disorder with depressed mood: Secondary | ICD-10-CM | POA: Diagnosis not present

## 2021-11-02 DIAGNOSIS — I10 Essential (primary) hypertension: Secondary | ICD-10-CM | POA: Diagnosis not present

## 2021-11-02 DIAGNOSIS — E8881 Metabolic syndrome: Secondary | ICD-10-CM

## 2021-11-02 DIAGNOSIS — Z79899 Other long term (current) drug therapy: Secondary | ICD-10-CM

## 2021-11-02 DIAGNOSIS — N6310 Unspecified lump in the right breast, unspecified quadrant: Secondary | ICD-10-CM

## 2021-11-02 MED ORDER — VALSARTAN-HYDROCHLOROTHIAZIDE 320-25 MG PO TABS: 1.0000 | ORAL_TABLET | Freq: Every day | ORAL | 1 refills | Status: DC

## 2021-11-02 MED ORDER — PRAVASTATIN SODIUM 40 MG PO TABS: 40.0000 mg | ORAL_TABLET | Freq: Every day | ORAL | 1 refills | Status: DC

## 2021-11-02 MED ORDER — AMLODIPINE BESYLATE 5 MG PO TABS: 5.0000 mg | ORAL_TABLET | Freq: Every day | ORAL | 1 refills | Status: AC

## 2021-11-02 MED ORDER — VENLAFAXINE HCL ER 150 MG PO CP24: 150.0000 mg | ORAL_CAPSULE | Freq: Every day | ORAL | 1 refills | Status: DC

## 2021-11-02 MED ORDER — CELECOXIB 200 MG PO CAPS: 200.0000 mg | ORAL_CAPSULE | Freq: Every day | ORAL | 0 refills | Status: DC

## 2021-11-03 LAB — LIPID PANEL
Cholesterol: 188 mg/dL (ref <200)
HDL: 60 mg/dL (ref >=50)
LDL Cholesterol (Calc): 106 mg/dL (calc) — ABNORMAL HIGH
Non-HDL Cholesterol (Calc): 128 mg/dL (calc) (ref <130)
Total CHOL/HDL Ratio: 3.1 (calc) (ref <5.0)
Triglycerides: 128 mg/dL (ref <150)

## 2021-11-03 LAB — HEPATIC FUNCTION PANEL
AG Ratio: 1.7 (calc) (ref 1.0–2.5)
ALT: 17 U/L (ref 6–29)
AST: 18 U/L (ref 10–35)
Albumin: 4.7 g/dL (ref 3.6–5.1)
Alkaline phosphatase (APISO): 110 U/L (ref 37–153)
Bilirubin, Direct: 0.1 mg/dL (ref 0.0–0.2)
Globulin: 2.7 g/dL (calc) (ref 1.9–3.7)
Indirect Bilirubin: 0.3 mg/dL (calc) (ref 0.2–1.2)
Total Bilirubin: 0.4 mg/dL (ref 0.2–1.2)
Total Protein: 7.4 g/dL (ref 6.1–8.1)

## 2021-11-07 ENCOUNTER — Ambulatory Visit: Payer: 59 | Admitting: Internal Medicine

## 2021-12-28 ENCOUNTER — Ambulatory Visit
Admission: RE | Admit: 2021-12-28 | Discharge: 2021-12-28 | Disposition: A | Discharge status: Home / Self Care | Payer: Self-pay | Source: Ambulatory Visit | Attending: Internal Medicine | Admitting: Internal Medicine

## 2021-12-28 DIAGNOSIS — N6310 Unspecified lump in the right breast, unspecified quadrant: Secondary | ICD-10-CM | POA: Insufficient documentation

## 2022-01-10 ENCOUNTER — Other Ambulatory Visit: Payer: Self-pay | Admitting: Family Medicine

## 2022-02-13 ENCOUNTER — Encounter: Payer: Self-pay | Admitting: Family Medicine

## 2022-02-14 ENCOUNTER — Ambulatory Visit: Payer: 59 | Admitting: Family Medicine

## 2022-04-09 ENCOUNTER — Other Ambulatory Visit: Payer: Self-pay | Admitting: Family Medicine

## 2022-04-10 ENCOUNTER — Other Ambulatory Visit: Payer: Self-pay | Admitting: Family Medicine

## 2022-04-10 DIAGNOSIS — F4321 Adjustment disorder with depressed mood: Secondary | ICD-10-CM

## 2022-04-10 DIAGNOSIS — E785 Hyperlipidemia, unspecified: Secondary | ICD-10-CM

## 2022-05-01 NOTE — Progress Notes (Deleted)
Name: Jessica Moore   MRN: [id number]    DOB: 1966/05/25   Date:05/01/2022       Progress Note  Subjective  Chief Complaint  Follow Up  HPI  HTN: bp is at goal, no chest pain, palpitation or sob. She needs refills of medication   Grieving: her 58 yo son died Spring 2022, she has been seeing a therapist, also taking Effexor and occasional BZD. She is still struggling but able to get up , showed and watches her God-daughter, started yoga classes last week and going for walks. Denies suicidal thoughts or ideation   Goiter: history of partial thyroidectomy July 2020, last TSH was at goal, denies dysphagia but has episodes that she feels like she is chocking    Perennial Allergic Rhinitis: she is taking singulair and zyrtec 10 mg daily, she has coughing spells when talking . She also has a nasal spray at home. Denies wheezing at this time, but when she has an URI. No nasal congestion or rhinorrhea.   Pre-diabetes: A1C trending up at 6%, she denies polyphagia, polydipsia or polyuria.   Dyslipidemia: high LDL twice last year, now on Pravastatin, denies myalgias. We will recheck labs  The 10-year ASCVD risk score (Arnett DK, et al., 2019) is: 6.8%   Values used to calculate the score:     Age: 55 years     Sex: Female     Is Non-Hispanic African American: Yes     Diabetic: No     Tobacco smoker: No     Systolic Blood Pressure: 149 mmHg     Is BP treated: Yes     HDL Cholesterol: 49 mg/dL     Total Cholesterol: 222 mg/dL   Patient Active Problem List   Diagnosis Date Noted   Perennial allergic rhinitis with seasonal variation 70/26/3785   Metabolic syndrome 88/50/2774   Chronic pain of left knee 11/02/2021   Dyslipidemia 11/02/2021   Dysfunctional grieving 11/02/2021   Thyroid nodule 04/23/2019   Benign hypertension 06/30/2018   Goiter 06/30/2018   Uterine fibroid 06/30/2018   Perimenopausal symptom 06/30/2018    Past Surgical History:  Procedure Laterality Date    BREAST BIOPSY     COLONOSCOPY WITH PROPOFOL N/A 03/25/2018   Procedure: COLONOSCOPY WITH PROPOFOL;  Surgeon: Jonathon Bellows, MD;  Location: [facility];  Service: Gastroenterology;  Laterality: N/A;   HAND TENDON SURGERY Right 1985   Pinky Finger   THYROIDECTOMY, PARTIAL Left 04/08/2019   Dr. Purcell Nails   UTERINE FIBROID EMBOLIZATION      Family History  Problem Relation Age of Onset   Diabetes Mother    Obesity Mother    Hypertension Maternal Grandmother    Alcohol abuse Maternal Grandmother    Obesity Son    Stroke Son    Heart failure Son    Breast cancer Neg Hx     Social History   Tobacco Use   Smoking status: Never   Smokeless tobacco: Never  Substance Use Topics   Alcohol use: Yes    Comment: occass     Current Outpatient Medications:    pravastatin (PRAVACHOL) 40 MG tablet, TAKE 1 TABLET BY MOUTH EVERY DAY, Disp: 90 tablet, Rfl: 1   venlafaxine XR (EFFEXOR-XR) 150 MG 24 hr capsule, TAKE 1 CAPSULE BY MOUTH DAILY WITH BREAKFAST., Disp: 90 capsule, Rfl: 1   amLODipine (NORVASC) 5 MG tablet, Take 1 tablet (5 mg total) by mouth daily., Disp: 90 tablet, Rfl: 1   BIOTIN PO, Take by  mouth., Disp: , Rfl:    celecoxib (CELEBREX) 200 MG capsule, TAKE 1 CAPSULE BY MOUTH EVERY DAY, Disp: 90 capsule, Rfl: 0   cetirizine (ZYRTEC) 10 MG tablet, Take 10 mg by mouth daily., Disp: , Rfl:    Cholecalciferol-Vitamin C (VITAMIN D3-VITAMIN C PO), Take by mouth., Disp: , Rfl:    clonazePAM (KLONOPIN) 0.5 MG tablet, Take 0.5 mg by mouth 2 (two) times daily as needed for anxiety., Disp: , Rfl:    Multiple Vitamin (MULTI VITAMIN PO), Take by mouth., Disp: , Rfl:    Turmeric (QC TUMERIC COMPLEX PO), Take by mouth., Disp: , Rfl:    valsartan-hydrochlorothiazide (DIOVAN-HCT) 320-25 MG tablet, Take 1 tablet by mouth daily., Disp: 90 tablet, Rfl: 1  No Known Allergies  I personally reviewed active problem list, medication list, allergies, family history, social history, health maintenance with  the patient/caregiver today.   ROS  ***  Objective  There were no vitals filed for this visit.  There is no height or weight on file to calculate BMI.  Physical Exam ***  No results found for this or any previous visit (from the past 2160 hour(s)).   PHQ2/9:    11/02/2021   10:41 AM 09/18/2019   10:56 AM 04/17/2019    8:48 AM 06/30/2018    3:29 PM  Depression screen PHQ 2/9  Decreased Interest 1 0 0 0  Down, Depressed, Hopeless 1 0 0 0  PHQ - 2 Score 2 0 0 0  Altered sleeping '3 1 3 2  '$ Tired, decreased energy '3 1 2 3  '$ Change in appetite 1 0 0 1  Feeling bad or failure about yourself  0 0 0 0  Trouble concentrating 1 0 0 0  Moving slowly or fidgety/restless 0 0 0 0  Suicidal thoughts 0  0 0  PHQ-9 Score '10 2 5 6  '$ Difficult doing work/chores  Somewhat difficult Not difficult at all Not difficult at all    phq 9 is {gen pos [id number]}   Fall Risk:    11/02/2021   10:40 AM 09/18/2019   10:56 AM 04/17/2019    8:47 AM 10/08/2018    1:50 PM 06/30/2018    3:29 PM  Fall Risk   Falls in the past year? 0 0 0 0 No  Number falls in past yr: 0 0 0 0   Injury with Fall? 0 0 0 0   Risk for fall due to : No Fall Risks      Follow up Falls prevention discussed Falls evaluation completed         Functional Status Survey:      Assessment & Plan  *** There are no diagnoses linked to this encounter.

## 2022-05-02 ENCOUNTER — Ambulatory Visit: Payer: 59 | Admitting: Family Medicine

## 2022-06-13 ENCOUNTER — Ambulatory Visit: Payer: Self-pay

## 2022-06-21 ENCOUNTER — Other Ambulatory Visit: Payer: Self-pay

## 2022-06-21 DIAGNOSIS — Z1231 Encounter for screening mammogram for malignant neoplasm of breast: Secondary | ICD-10-CM

## 2022-06-25 ENCOUNTER — Ambulatory Visit: Payer: Self-pay | Attending: Hematology and Oncology | Admitting: Hematology and Oncology

## 2022-06-25 ENCOUNTER — Ambulatory Visit
Admission: RE | Admit: 2022-06-25 | Discharge: 2022-06-25 | Disposition: A | Discharge status: Home / Self Care | Payer: 59 | Source: Ambulatory Visit | Attending: Obstetrics and Gynecology | Admitting: Obstetrics and Gynecology

## 2022-06-25 VITALS — BP 117/84 | Wt 182.0 lb

## 2022-06-25 DIAGNOSIS — Z1231 Encounter for screening mammogram for malignant neoplasm of breast: Secondary | ICD-10-CM | POA: Insufficient documentation

## 2022-06-25 NOTE — Progress Notes (Signed)
Jessica Moore is a 56 y.o. female who presents to [facility] clinic today with no complaints.    Pap Smear: Pap not smear completed today. Last Pap smear was 03/05/2018 at [facility] clinic and was normal. Per patient has no history of an abnormal Pap smear. Last Pap smear result is available in Epic.   Physical exam: Breasts Breasts symmetrical. No skin abnormalities bilateral breasts. No nipple retraction bilateral breasts. No nipple discharge bilateral breasts. No lymphadenopathy. No lumps palpated bilateral breasts.   MM DIAG BREAST TOMO UNI RIGHT  Result Date: 12/28/2021 CLINICAL DATA:  BI-RADS 3 follow-up of a RIGHT breast mass, initiated September 2022. history of a biopsy-proven benign area of Pash in 2019. EXAM: DIGITAL DIAGNOSTIC UNILATERAL RIGHT MAMMOGRAM WITH TOMOSYNTHESIS AND CAD; ULTRASOUND RIGHT BREAST LIMITED TECHNIQUE: Right digital diagnostic mammography and breast tomosynthesis was performed. The images were evaluated with computer-aided detection.; Targeted ultrasound examination of the right breast was performed COMPARISON:  Previous exam(s). ACR Breast Density Category c: The breast tissue is heterogeneously dense, which may obscure small masses. FINDINGS: Diagnostic images of the RIGHT breast demonstrate an oval circumscribed mass in the RIGHT outer breast at middle to posterior depth. It is minimally increased in size in comparison to prior. No additional suspicious findings are noted in the RIGHT breast. On physical exam, no suspicious mass is appreciated. Targeted ultrasound was performed of the RIGHT outer breast. At 9 o'clock 6 cm from the nipple, there is an oval circumscribed anechoic mass with posterior acoustic enhancement. It measures 10 x 13 x 6 mm and is consistent with a benign simple cyst. IMPRESSION: There is a benign cyst in the RIGHT breast at 9 o'clock 6 cm from the nipple. As such, recommend return to annual screening mammography, due September  2023. RECOMMENDATION: Bilateral screening mammogram, due September 2023. I have discussed the findings and recommendations with the patient. If applicable, a reminder letter will be sent to the patient regarding the next appointment. BI-RADS CATEGORY  2: Benign. Electronically Signed   By: Valentino Saxon M.D.   On: 12/28/2021 15:45  MM DIAG BREAST TOMO BILATERAL  Result Date: 06/29/2021 CLINICAL DATA:  Follow-up of probably benign right breast 1 o'clock mass, first seen in December 2019. History of benign core needle biopsy of right breast 3 o'clock mass. EXAM: DIGITAL DIAGNOSTIC BILATERAL MAMMOGRAM WITH TOMOSYNTHESIS AND CAD; ULTRASOUND RIGHT BREAST LIMITED TECHNIQUE: Bilateral digital diagnostic mammography and breast tomosynthesis was performed. The images were evaluated with computer-aided detection.; Targeted ultrasound examination of the right breast was performed COMPARISON:  Previous exam(s). ACR Breast Density Category c: The breast tissue is heterogeneously dense, which may obscure small masses. FINDINGS: Mammographically, there are no suspicious masses, areas of architectural distortion or microcalcifications in either breast. There is a 9 mm circumscribed mass in the slightly upper outer right breast, middle depth, with benign appearance. Targeted right breast ultrasound is performed, demonstrating stable benign-appearing mass in the right breast 1 o'clock 6 cm from the nipple measuring 1.0 x 0.3 x 0.5 cm. In the right breast 9 o'clock 6 cm from nipple there is a similar appearing hypoechoic circumscribed horizontally oriented benign-appearing mass measuring 1.0 x 0.5 x 0.9 cm. This last finding corresponds to the mammographically seen mass. IMPRESSION: Benign right breast 1 o'clock mass. Given the 2 year stability, no further imaging follow-up is recommended for this finding. Probably benign right breast 9 o'clock mass, for which short-term imaging follow-up is recommended. RECOMMENDATION: Right  diagnostic mammogram and focused right  breast ultrasound in 6 months. I have discussed the findings and recommendations with the patient. If applicable, a reminder letter will be sent to the patient regarding the next appointment. BI-RADS CATEGORY  3: Probably benign. Electronically Signed   By: Fidela Salisbury M.D.   On: 06/29/2021 15:54  MM DIAG BREAST TOMO BILATERAL  Result Date: 09/18/2019 CLINICAL DATA:  Delayed follow-up of a probable benign right breast mass at 1 o'clock. Patient had a benign biopsy of the right breast in the 3 o'clock position on 09/08/2018. EXAM: DIGITAL DIAGNOSTIC BILATERAL MAMMOGRAM WITH CAD AND TOMO ULTRASOUND RIGHT BREAST COMPARISON:  Previous exam(s). ACR Breast Density Category c: The breast tissue is heterogeneously dense, which may obscure small masses. FINDINGS: No suspicious mass, malignant type microcalcifications or distortion detected in either breast. Mammographic images were processed with CAD. Targeted ultrasound is performed, showing a stable hypoechoic mass in the right breast at 1 o'clock 6 cm from the nipple measuring 1.0 x 0.3 x 0.5 cm. On the prior ultrasound dated 09/02/2018 it measured 1.0 x 0.3 x 0.6 cm. IMPRESSION: Stable probable benign mass in the 1 o'clock region of the right breast. RECOMMENDATION: Bilateral diagnostic mammogram and right breast ultrasound in 1 year is recommended to document stability of the mass in the 1 o'clock region of the right breast for 2 years. I have discussed the findings and recommendations with the patient. If applicable, a reminder letter will be sent to the patient regarding the next appointment. BI-RADS CATEGORY  3: Probably benign. Electronically Signed   By: Lillia Mountain M.D.   On: 09/18/2019 14:23   MM CLIP PLACEMENT RIGHT  Result Date: 09/08/2018 CLINICAL DATA:  Patient with RIGHT breast mass presents today for ultrasound-guided core biopsy. EXAM: DIAGNOSTIC RIGHT MAMMOGRAM POST ULTRASOUND BIOPSY COMPARISON:   Previous exam(s). FINDINGS: Mammographic images were obtained following ultrasound guided biopsy of the RIGHT breast mass at the 3 o'clock axis. Heart shaped clip is appropriately positioned, corresponding to the additional mammographic finding. IMPRESSION: Heart shaped clip is appropriately positioned within the RIGHT breast at the site of the targeted mass at the 3 o'clock axis, corresponding to the original mammographic finding. Final Assessment: Post Procedure Mammograms for Marker Placement Electronically Signed   By: Franki Cabot M.D.   On: 09/08/2018 09:11   MM DIAG BREAST TOMO UNI RIGHT  Result Date: 09/02/2018 CLINICAL DATA:  Screening recall for asymmetry seen in the inner right breast on the CC view only. EXAM: DIGITAL DIAGNOSTIC UNILATERAL RIGHT MAMMOGRAM WITH CAD AND TOMO RIGHT BREAST ULTRASOUND COMPARISON:  Mammogram dated 07/10/2018. ACR Breast Density Category c: The breast tissue is heterogeneously dense, which may obscure small masses. FINDINGS: Additional tomograms were performed of the right breast. The initially questioned possible right breast asymmetry is felt to persist although less conspicuous on the additional imaging with several small oval circumscribed masses seen in the inner right breast on the CC views. Mammographic images were processed with CAD. Physical examination of the upper inner right breast does not reveal any palpable masses. Targeted ultrasound of the right breast was performed demonstrating an oval circumscribed gently lobulated mass at 1 o'clock 6 cm from nipple measuring 1 x 0.3 x 0.6 cm and felt to represent a cluster of complicated cysts (note that there are 2 sets of sonographic images for this mass). This is felt to correspond with the asymmetry initially seen on screening mammography. Numerous smaller cysts are seen in the upper inner quadrant of the right breast, with a cyst at  2 o'clock 8 cm from nipple measuring 0.5 x 0.3 x 0.4 cm. There is an irregular  hypoechoic mass in the right breast at the 3 o'clock position 6 cm from nipple measuring 0.5 x 0.5 x 0.7 cm (note that there are 2 sets of sonographic images for this mass). No definite right axillary lymphadenopathy seen. A lymph node with a borderline thickened cortex and preserved fatty hilum is felt to be within normal limits for the patient and similar in appearance when compared to the left axilla. IMPRESSION: 1. Indeterminate mass in the right breast at the 3 o'clock position 6 cm from nipple. 2. Probably benign mass in the right breast at 1 o'clock 6 cm from nipple felt to represent a cluster of complicated cysts. RECOMMENDATION: 1. Recommend ultrasound-guided biopsy of the mass in the right breast at the 3 o'clock position. 2. If results from the biopsy of the mass in the right breast at the 3 o'clock position return as benign, then recommend six-month follow-up right breast mammogram and ultrasound to demonstrate stability of probable cluster of complicated cysts in the right breast at the 1 o'clock position. If biopsy results return as abnormal/surgical, then recommend the patient return for biopsy of this mass at the 3 o'clock position. I have discussed the findings and recommendations with the patient. Results were also provided in writing at the conclusion of the visit. If applicable, a reminder letter will be sent to the patient regarding the next appointment. BI-RADS CATEGORY  4: Suspicious. Electronically Signed   By: Everlean Alstrom M.D.   On: 09/02/2018 10:15   MM 3D SCREEN BREAST BILATERAL  Result Date: 07/29/2018 CLINICAL DATA:  Screening. EXAM: DIGITAL SCREENING BILATERAL MAMMOGRAM WITH TOMO AND CAD COMPARISON:  No priors available ACR Breast Density Category c: The breast tissue is heterogeneously dense, which may obscure small masses. FINDINGS: In the right breast, a possible asymmetry warrants further evaluation. In the left breast, no findings suspicious for malignancy. Images were  processed with CAD. IMPRESSION: Further evaluation is suggested for possible asymmetry in the right breast. RECOMMENDATION: Diagnostic mammogram and possibly ultrasound of the right breast. (Code:[id number]) The patient will be contacted regarding the findings, and additional imaging will be scheduled. BI-RADS CATEGORY  0: Incomplete. Need additional imaging evaluation and/or prior mammograms for comparison. Electronically Signed   By: Curlene Dolphin M.D.   On: 07/29/2018 17:06        Pelvic/Bimanual Pap is not indicated today    Smoking History: Patient has never smoked and was not referred to quit line.    Patient Navigation: Patient education provided. Access to services provided for patient through [facility] program. No interpreter provided. No transportation provided   Colorectal Cancer Screening: Per patient has had colonoscopy completed on 03/25/2018 with benign findings.   No complaints today.    Breast and Cervical Cancer Risk Assessment: Patient does not have family history of breast cancer, known genetic mutations, or radiation treatment to the chest before age 2. Patient does not have history of cervical dysplasia, immunocompromised, or DES exposure in-utero.  Risk Assessment   No risk assessment data     A: [facility] exam without pap smear No complaints with benign exam.   P: [facility]. Appointment scheduled 06/25/2022.  Melodye Ped, NP 06/25/2022 8:32 AM

## 2022-06-26 ENCOUNTER — Other Ambulatory Visit: Payer: Self-pay | Admitting: Obstetrics and Gynecology

## 2022-06-26 DIAGNOSIS — R928 Other abnormal and inconclusive findings on diagnostic imaging of breast: Secondary | ICD-10-CM

## 2022-06-26 DIAGNOSIS — R921 Mammographic calcification found on diagnostic imaging of breast: Secondary | ICD-10-CM

## 2022-06-28 ENCOUNTER — Ambulatory Visit
Admission: RE | Admit: 2022-06-28 | Discharge: 2022-06-28 | Disposition: A | Discharge status: Home / Self Care | Payer: 59 | Source: Ambulatory Visit | Attending: Obstetrics and Gynecology | Admitting: Obstetrics and Gynecology

## 2022-06-28 DIAGNOSIS — R928 Other abnormal and inconclusive findings on diagnostic imaging of breast: Secondary | ICD-10-CM | POA: Insufficient documentation

## 2022-06-28 DIAGNOSIS — R921 Mammographic calcification found on diagnostic imaging of breast: Secondary | ICD-10-CM | POA: Insufficient documentation

## 2022-07-01 ENCOUNTER — Other Ambulatory Visit: Payer: Self-pay | Admitting: Family Medicine

## 2022-07-01 DIAGNOSIS — I10 Essential (primary) hypertension: Secondary | ICD-10-CM

## 2022-07-02 ENCOUNTER — Other Ambulatory Visit: Payer: Self-pay

## 2022-07-02 DIAGNOSIS — I10 Essential (primary) hypertension: Secondary | ICD-10-CM

## 2022-07-02 NOTE — Telephone Encounter (Signed)
She does not have one scheduled

## 2022-07-02 NOTE — Telephone Encounter (Signed)
Pt said that she is out of town for 2 wks and will give [place] a call when she gets back.

## 2022-07-04 ENCOUNTER — Telehealth: Payer: Self-pay | Admitting: Family Medicine

## 2022-07-04 DIAGNOSIS — I10 Essential (primary) hypertension: Secondary | ICD-10-CM

## 2022-07-04 NOTE — Telephone Encounter (Signed)
Pt needs an appt for refills.  

## 2022-07-04 NOTE — Telephone Encounter (Signed)
Lvm to schedule an appt before the request for refill can be done per [facility]

## 2022-07-05 ENCOUNTER — Other Ambulatory Visit: Payer: Self-pay | Admitting: Family Medicine

## 2022-07-12 DIAGNOSIS — R202 Paresthesia of skin: Secondary | ICD-10-CM | POA: Diagnosis not present

## 2022-07-12 DIAGNOSIS — I10 Essential (primary) hypertension: Secondary | ICD-10-CM | POA: Diagnosis not present

## 2022-07-12 DIAGNOSIS — N951 Menopausal and female climacteric states: Secondary | ICD-10-CM | POA: Diagnosis not present

## 2022-07-12 DIAGNOSIS — I498 Other specified cardiac arrhythmias: Secondary | ICD-10-CM | POA: Diagnosis not present

## 2022-07-12 DIAGNOSIS — R0602 Shortness of breath: Secondary | ICD-10-CM | POA: Diagnosis not present

## 2022-07-12 DIAGNOSIS — R7309 Other abnormal glucose: Secondary | ICD-10-CM | POA: Diagnosis not present

## 2022-07-12 DIAGNOSIS — R9431 Abnormal electrocardiogram [ECG] [EKG]: Secondary | ICD-10-CM | POA: Diagnosis not present

## 2022-07-12 DIAGNOSIS — R69 Illness, unspecified: Secondary | ICD-10-CM | POA: Diagnosis not present

## 2022-07-12 DIAGNOSIS — E010 Iodine-deficiency related diffuse (endemic) goiter: Secondary | ICD-10-CM | POA: Diagnosis not present

## 2022-07-14 IMAGING — MG MM DIGITAL DIAGNOSTIC UNILAT*R* W/ TOMO W/ CAD
4 series · 4 of 12 positions shown · non-contrast
Comparison: Previous exam(s).

CLINICAL DATA: BI-RADS 3 follow-up of a RIGHT breast mass,
initiated June 2021. history of a biopsy-proven benign area of
Pash in 9210.

EXAM:
DIGITAL DIAGNOSTIC UNILATERAL RIGHT MAMMOGRAM WITH TOMOSYNTHESIS AND
CAD; ULTRASOUND RIGHT BREAST LIMITED
TECHNIQUE: Right digital diagnostic mammography and breast tomosynthesis was
performed. The images were evaluated with computer-aided detection.;
Targeted ultrasound examination of the right breast was performed

[R MLO synth-2D]
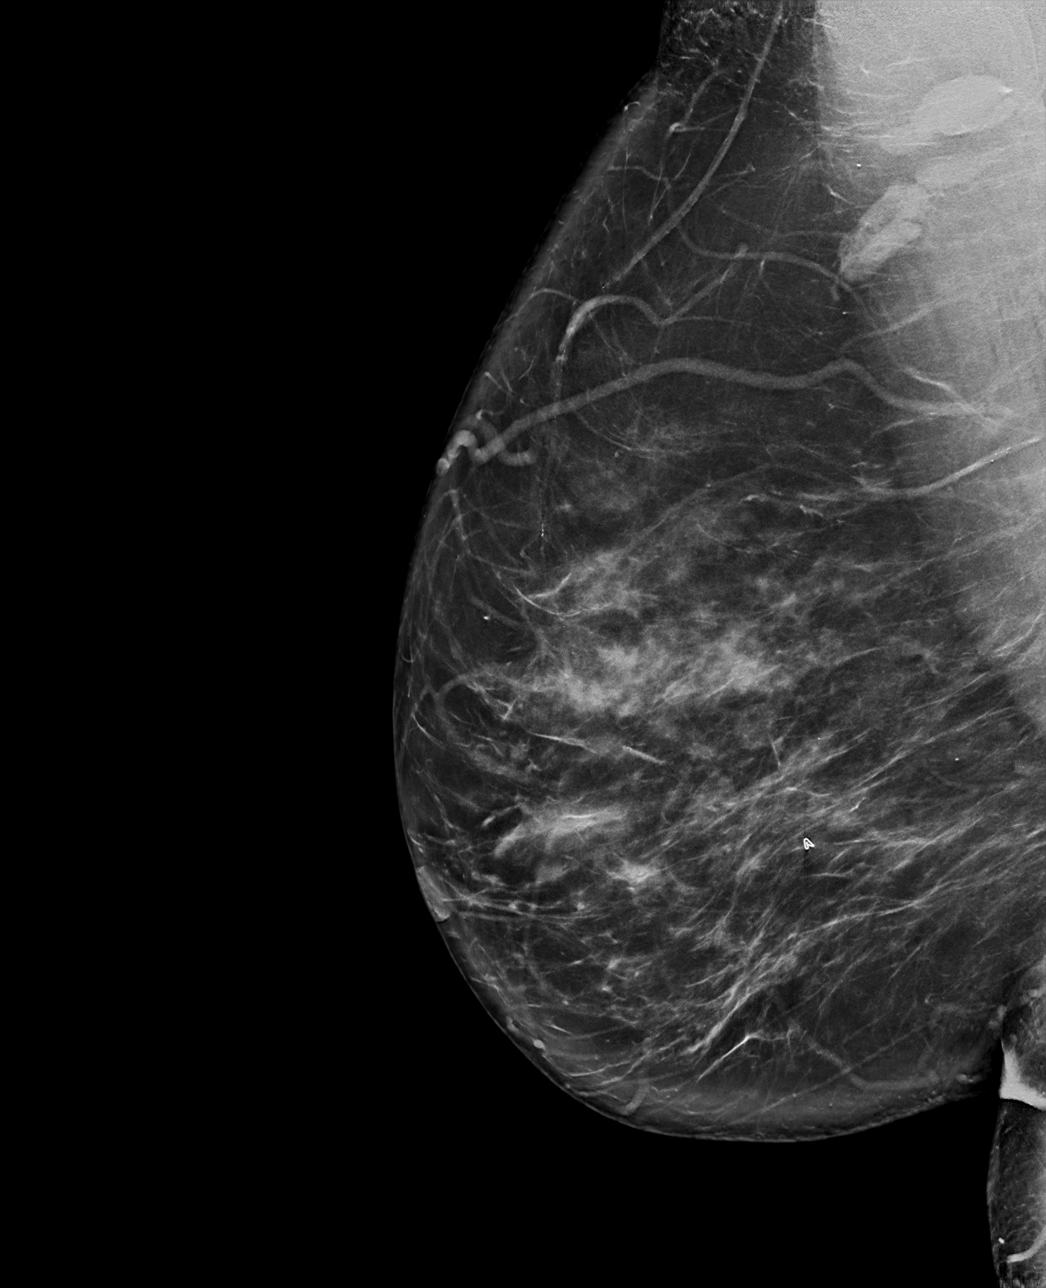

[R CC synth-2D]
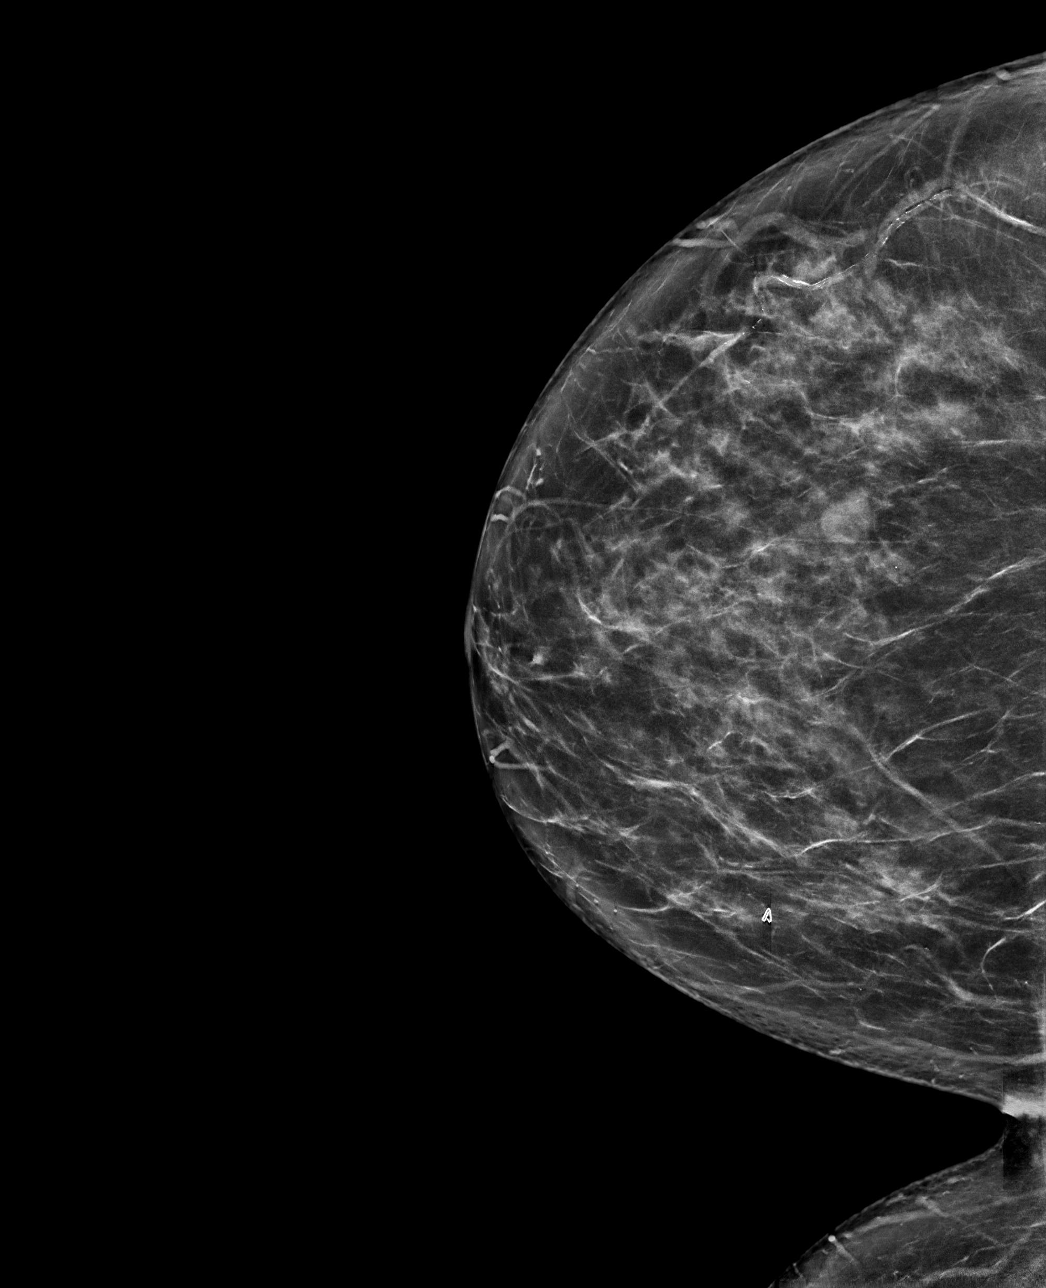

[R CC tomo · tomo slice 42/83.0]
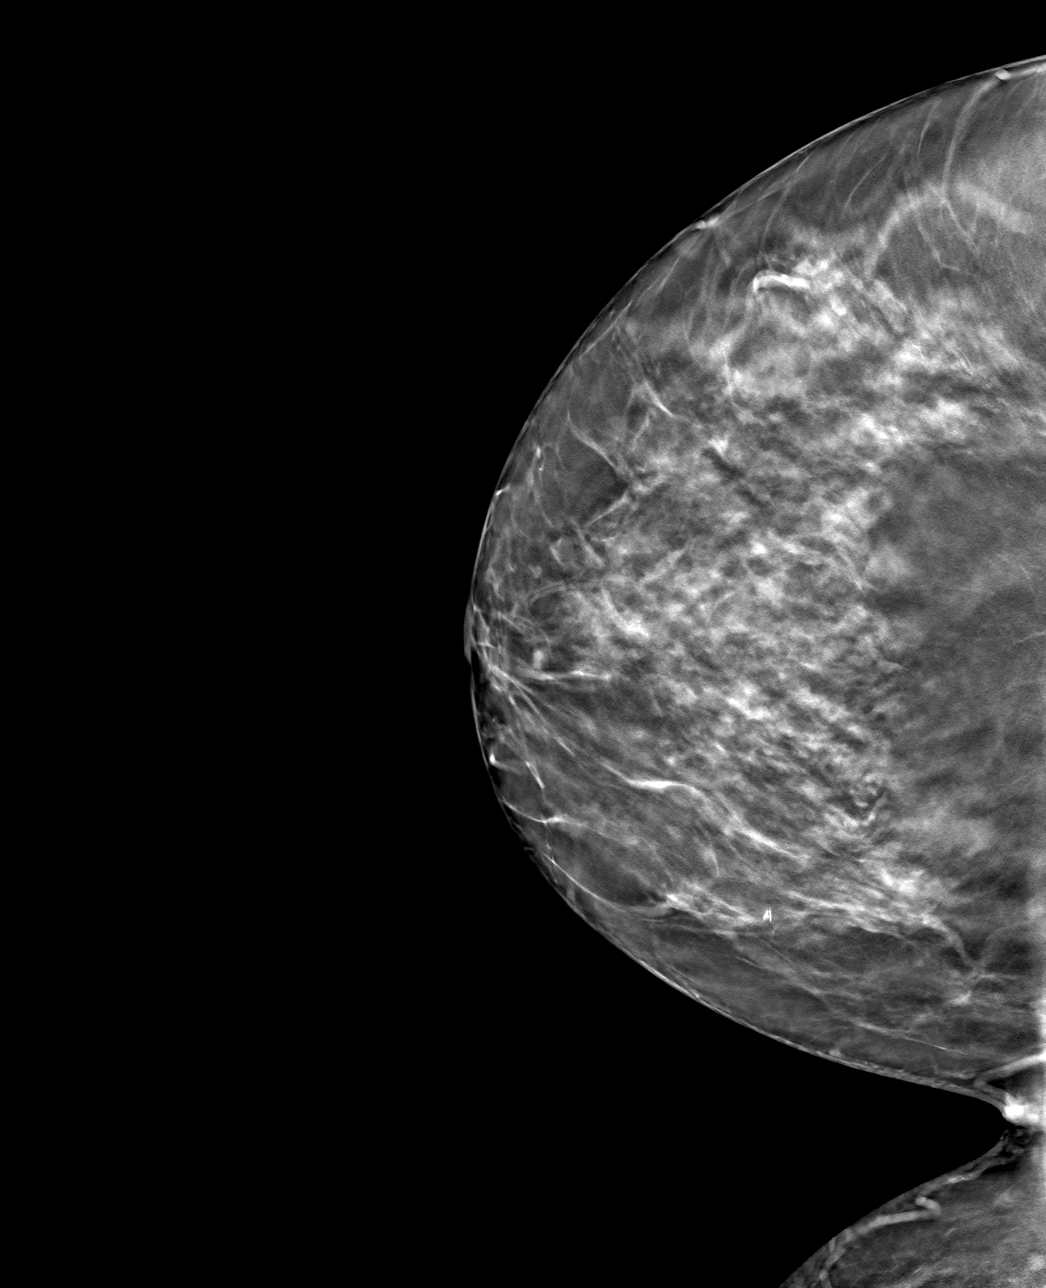

[R MLO tomo · tomo slice 48/95.0]
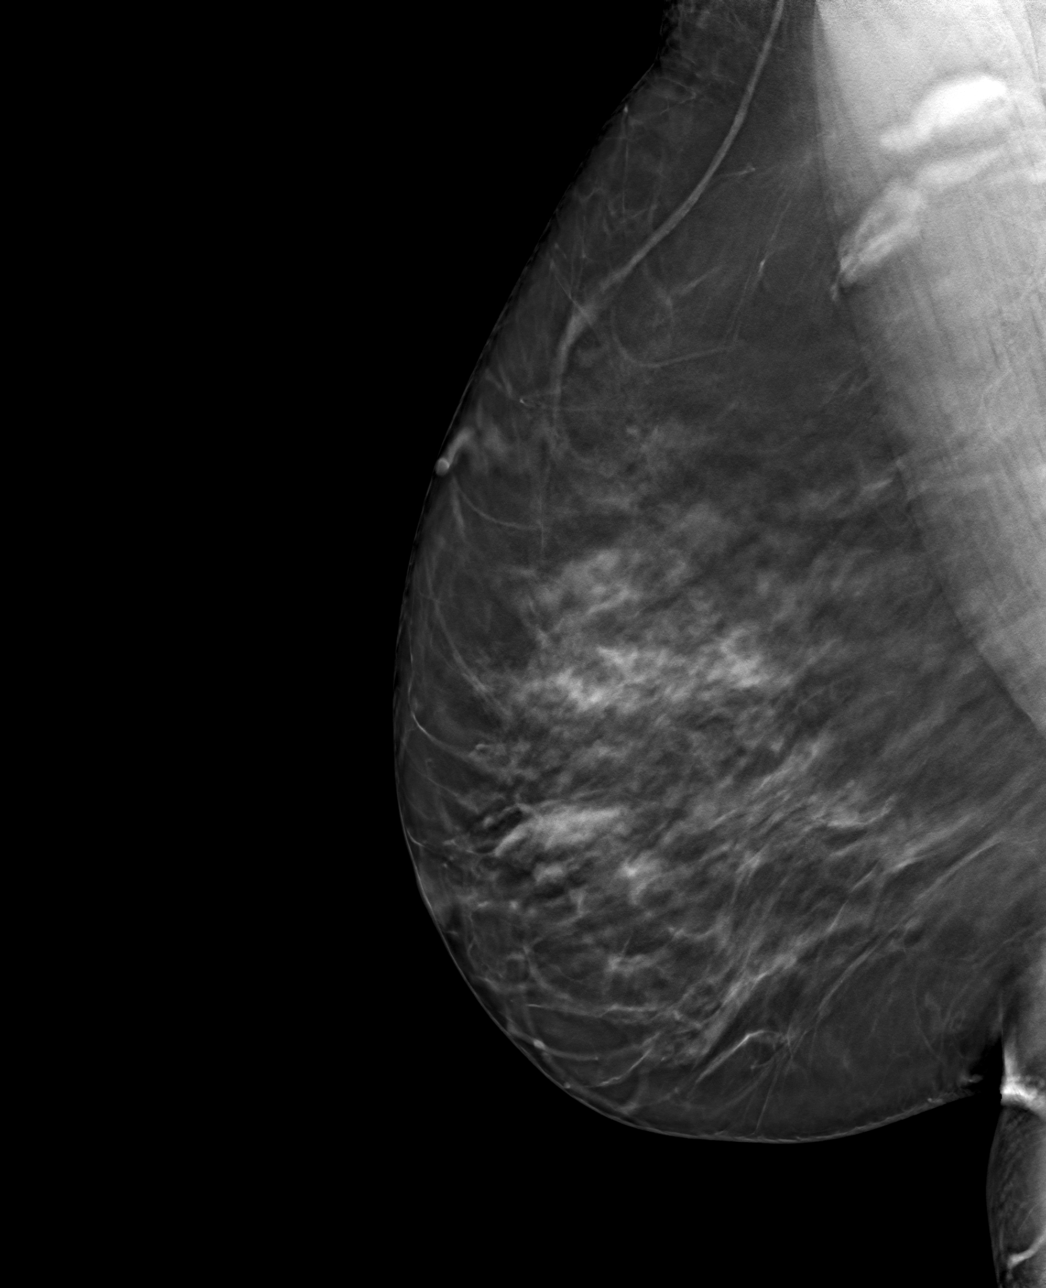

[4 of 12 positions shown; findings below may reference images not displayed]

ACR Breast Density Category c: The breast tissue is heterogeneously
dense, which may obscure small masses.
FINDINGS: Diagnostic images of the RIGHT breast demonstrate an oval
circumscribed mass in the RIGHT outer breast at middle to posterior
depth. It is minimally increased in size in comparison to prior. No
additional suspicious findings are noted in the RIGHT breast.

On physical exam, no suspicious mass is appreciated.

Targeted ultrasound was performed of the RIGHT outer breast. At 9
o'clock 6 cm from the nipple, there is an oval circumscribed
anechoic mass with posterior acoustic enhancement. It measures 10 x
13 x 6 mm and is consistent with a benign simple cyst.
IMPRESSION: There is a benign cyst in the RIGHT breast at 9 o'clock 6 cm from
the nipple. As such, recommend return to annual screening
mammography, due June 2022.

RECOMMENDATION:
Bilateral screening mammogram, due June 2022.

I have discussed the findings and recommendations with the patient.
If applicable, a reminder letter will be sent to the patient
regarding the next appointment.

BI-RADS CATEGORY  2: Benign.

## 2022-08-13 ENCOUNTER — Other Ambulatory Visit: Payer: Self-pay | Admitting: Internal Medicine

## 2022-08-13 DIAGNOSIS — J3089 Other allergic rhinitis: Secondary | ICD-10-CM | POA: Diagnosis not present

## 2022-08-13 DIAGNOSIS — I499 Cardiac arrhythmia, unspecified: Secondary | ICD-10-CM | POA: Diagnosis not present

## 2022-08-13 DIAGNOSIS — R69 Illness, unspecified: Secondary | ICD-10-CM | POA: Diagnosis not present

## 2022-08-13 DIAGNOSIS — I498 Other specified cardiac arrhythmias: Secondary | ICD-10-CM | POA: Diagnosis not present

## 2022-08-13 DIAGNOSIS — R079 Chest pain, unspecified: Secondary | ICD-10-CM | POA: Diagnosis not present

## 2022-08-13 DIAGNOSIS — E8881 Metabolic syndrome: Secondary | ICD-10-CM | POA: Diagnosis not present

## 2022-08-13 DIAGNOSIS — R002 Palpitations: Secondary | ICD-10-CM | POA: Diagnosis not present

## 2022-08-13 DIAGNOSIS — J302 Other seasonal allergic rhinitis: Secondary | ICD-10-CM | POA: Diagnosis not present

## 2022-08-13 DIAGNOSIS — E785 Hyperlipidemia, unspecified: Secondary | ICD-10-CM | POA: Diagnosis not present

## 2022-08-13 DIAGNOSIS — R0602 Shortness of breath: Secondary | ICD-10-CM

## 2022-08-13 DIAGNOSIS — I10 Essential (primary) hypertension: Secondary | ICD-10-CM | POA: Diagnosis not present

## 2022-08-15 DIAGNOSIS — M47812 Spondylosis without myelopathy or radiculopathy, cervical region: Secondary | ICD-10-CM | POA: Diagnosis not present

## 2022-08-15 DIAGNOSIS — M542 Cervicalgia: Secondary | ICD-10-CM | POA: Diagnosis not present

## 2022-08-15 DIAGNOSIS — I10 Essential (primary) hypertension: Secondary | ICD-10-CM | POA: Diagnosis not present

## 2022-08-15 DIAGNOSIS — S46811A Strain of other muscles, fascia and tendons at shoulder and upper arm level, right arm, initial encounter: Secondary | ICD-10-CM | POA: Diagnosis not present

## 2022-08-15 DIAGNOSIS — Z2821 Immunization not carried out because of patient refusal: Secondary | ICD-10-CM | POA: Diagnosis not present

## 2022-08-15 DIAGNOSIS — R69 Illness, unspecified: Secondary | ICD-10-CM | POA: Diagnosis not present

## 2022-08-29 ENCOUNTER — Other Ambulatory Visit (HOSPITAL_COMMUNITY): Payer: Self-pay | Admitting: Emergency Medicine

## 2022-08-29 ENCOUNTER — Telehealth (HOSPITAL_COMMUNITY): Payer: Self-pay | Admitting: Emergency Medicine

## 2022-08-29 DIAGNOSIS — R079 Chest pain, unspecified: Secondary | ICD-10-CM

## 2022-08-29 MED ORDER — METOPROLOL TARTRATE 100 MG PO TABS: 100.0000 mg | ORAL_TABLET | Freq: Once | ORAL | 0 refills | Status: AC

## 2022-08-29 MED ORDER — IVABRADINE HCL 5 MG PO TABS: 15.0000 mg | ORAL_TABLET | Freq: Once | ORAL | 0 refills | Status: AC

## 2022-08-29 NOTE — Telephone Encounter (Signed)
Reaching out to patient to offer assistance regarding upcoming cardiac imaging study; pt verbalizes understanding of appt date/time, parking situation and where to check in, pre-test NPO status and medications ordered, and verified current allergies; name and call back number provided for further questions should they arise Jessica Bond RN Navigator Cardiac Imaging [facility] [phone] office [phone] cell  Arrival 945  '100mg'$  metoprolol + '15mg'$  ivabradine  sent to [facility] [address] Cathrine Muster diovan, allergy meds

## 2022-08-30 ENCOUNTER — Ambulatory Visit
Admission: RE | Admit: 2022-08-30 | Discharge: 2022-08-30 | Disposition: A | Discharge status: Home / Self Care | Payer: 59 | Source: Ambulatory Visit | Attending: Internal Medicine | Admitting: Internal Medicine

## 2022-08-30 DIAGNOSIS — R0602 Shortness of breath: Secondary | ICD-10-CM | POA: Diagnosis not present

## 2022-08-30 LAB — POCT I-STAT CREATININE: Creatinine, Ser: 0.9 mg/dL (ref 0.44–1.00)

## 2022-08-30 MED ORDER — NITROGLYCERIN 0.4 MG SL SUBL
0.8000 mg | SUBLINGUAL_TABLET | Freq: Once | SUBLINGUAL | Status: AC
  Administered 2022-08-30: 0.8 mg via SUBLINGUAL

## 2022-08-30 MED ORDER — IOHEXOL 350 MG/ML SOLN
75.0000 mL | Freq: Once | INTRAVENOUS | Status: AC | PRN
  Administered 2022-08-30: 75 mL via INTRAVENOUS

## 2022-08-30 NOTE — Progress Notes (Signed)
Patient tolerated procedure well. Ambulate w/o difficulty. Denies light headedness or being dizzy. Sitting in chair drinking water provided. Encouraged to drink extra water today and reasoning explained. Verbalized understanding. All questions answered. ABC intact. No further needs. Discharge from procedure area w/o issues.   °

## 2022-09-28 DIAGNOSIS — R002 Palpitations: Secondary | ICD-10-CM | POA: Diagnosis not present

## 2022-09-28 DIAGNOSIS — I499 Cardiac arrhythmia, unspecified: Secondary | ICD-10-CM | POA: Diagnosis not present

## 2022-10-26 DIAGNOSIS — I10 Essential (primary) hypertension: Secondary | ICD-10-CM | POA: Diagnosis not present

## 2022-10-26 DIAGNOSIS — Z780 Asymptomatic menopausal state: Secondary | ICD-10-CM | POA: Diagnosis not present

## 2022-10-26 DIAGNOSIS — R69 Illness, unspecified: Secondary | ICD-10-CM | POA: Diagnosis not present

## 2022-10-26 DIAGNOSIS — Z0000 Encounter for general adult medical examination without abnormal findings: Secondary | ICD-10-CM | POA: Diagnosis not present

## 2022-10-26 DIAGNOSIS — R9431 Abnormal electrocardiogram [ECG] [EKG]: Secondary | ICD-10-CM | POA: Diagnosis not present

## 2022-10-26 DIAGNOSIS — I498 Other specified cardiac arrhythmias: Secondary | ICD-10-CM | POA: Diagnosis not present

## 2023-04-19 DIAGNOSIS — I499 Cardiac arrhythmia, unspecified: Secondary | ICD-10-CM | POA: Diagnosis not present

## 2023-04-19 DIAGNOSIS — I739 Peripheral vascular disease, unspecified: Secondary | ICD-10-CM | POA: Diagnosis not present

## 2023-04-19 DIAGNOSIS — R32 Unspecified urinary incontinence: Secondary | ICD-10-CM | POA: Diagnosis not present

## 2023-04-19 DIAGNOSIS — I10 Essential (primary) hypertension: Secondary | ICD-10-CM | POA: Diagnosis not present

## 2023-04-19 DIAGNOSIS — F324 Major depressive disorder, single episode, in partial remission: Secondary | ICD-10-CM | POA: Diagnosis not present

## 2023-04-19 DIAGNOSIS — E785 Hyperlipidemia, unspecified: Secondary | ICD-10-CM | POA: Diagnosis not present

## 2023-04-19 DIAGNOSIS — Z9181 History of falling: Secondary | ICD-10-CM | POA: Diagnosis not present

## 2023-04-19 DIAGNOSIS — Z0000 Encounter for general adult medical examination without abnormal findings: Secondary | ICD-10-CM | POA: Diagnosis not present

## 2023-04-19 DIAGNOSIS — Z833 Family history of diabetes mellitus: Secondary | ICD-10-CM | POA: Diagnosis not present

## 2023-04-19 DIAGNOSIS — Z8249 Family history of ischemic heart disease and other diseases of the circulatory system: Secondary | ICD-10-CM | POA: Diagnosis not present

## 2023-04-19 DIAGNOSIS — M62838 Other muscle spasm: Secondary | ICD-10-CM | POA: Diagnosis not present

## 2023-04-19 DIAGNOSIS — Z87892 Personal history of anaphylaxis: Secondary | ICD-10-CM | POA: Diagnosis not present

## 2023-04-19 DIAGNOSIS — Z008 Encounter for other general examination: Secondary | ICD-10-CM | POA: Diagnosis not present

## 2023-04-19 DIAGNOSIS — Z881 Allergy status to other antibiotic agents status: Secondary | ICD-10-CM | POA: Diagnosis not present

## 2023-04-26 DIAGNOSIS — F411 Generalized anxiety disorder: Secondary | ICD-10-CM | POA: Diagnosis not present

## 2023-04-26 DIAGNOSIS — R0602 Shortness of breath: Secondary | ICD-10-CM | POA: Diagnosis not present

## 2023-04-26 DIAGNOSIS — I498 Other specified cardiac arrhythmias: Secondary | ICD-10-CM | POA: Diagnosis not present

## 2023-04-26 DIAGNOSIS — S46811A Strain of other muscles, fascia and tendons at shoulder and upper arm level, right arm, initial encounter: Secondary | ICD-10-CM | POA: Diagnosis not present

## 2023-04-26 DIAGNOSIS — D1720 Benign lipomatous neoplasm of skin and subcutaneous tissue of unspecified limb: Secondary | ICD-10-CM | POA: Diagnosis not present

## 2023-04-26 DIAGNOSIS — I10 Essential (primary) hypertension: Secondary | ICD-10-CM | POA: Diagnosis not present

## 2023-06-25 ENCOUNTER — Other Ambulatory Visit: Payer: Self-pay

## 2023-06-25 DIAGNOSIS — N644 Mastodynia: Secondary | ICD-10-CM

## 2023-07-19 DIAGNOSIS — I10 Essential (primary) hypertension: Secondary | ICD-10-CM | POA: Diagnosis not present

## 2023-07-24 DIAGNOSIS — D1720 Benign lipomatous neoplasm of skin and subcutaneous tissue of unspecified limb: Secondary | ICD-10-CM | POA: Diagnosis not present

## 2023-07-24 DIAGNOSIS — F411 Generalized anxiety disorder: Secondary | ICD-10-CM | POA: Diagnosis not present

## 2023-07-24 DIAGNOSIS — I10 Essential (primary) hypertension: Secondary | ICD-10-CM | POA: Diagnosis not present

## 2023-07-24 DIAGNOSIS — R0981 Nasal congestion: Secondary | ICD-10-CM | POA: Diagnosis not present

## 2023-07-24 DIAGNOSIS — N951 Menopausal and female climacteric states: Secondary | ICD-10-CM | POA: Diagnosis not present

## 2023-07-24 DIAGNOSIS — E8881 Metabolic syndrome: Secondary | ICD-10-CM | POA: Diagnosis not present

## 2023-07-24 DIAGNOSIS — R053 Chronic cough: Secondary | ICD-10-CM | POA: Diagnosis not present

## 2023-07-24 DIAGNOSIS — R635 Abnormal weight gain: Secondary | ICD-10-CM | POA: Diagnosis not present

## 2023-07-29 ENCOUNTER — Ambulatory Visit
Admission: RE | Admit: 2023-07-29 | Discharge: 2023-07-29 | Disposition: A | Discharge status: Home / Self Care | Payer: 59 | Source: Ambulatory Visit | Attending: Obstetrics and Gynecology | Admitting: Obstetrics and Gynecology

## 2023-07-29 ENCOUNTER — Ambulatory Visit: Payer: 59 | Attending: Hematology and Oncology | Admitting: Hematology and Oncology

## 2023-07-29 ENCOUNTER — Other Ambulatory Visit: Payer: Self-pay

## 2023-07-29 ENCOUNTER — Other Ambulatory Visit (HOSPITAL_COMMUNITY)
Admission: RE | Admit: 2023-07-29 | Discharge: 2023-07-29 | Disposition: A | Discharge status: Home / Self Care | Payer: 59 | Source: Ambulatory Visit | Attending: Obstetrics and Gynecology | Admitting: Obstetrics and Gynecology

## 2023-07-29 VITALS — BP 123/85 | Wt 181.4 lb

## 2023-07-29 DIAGNOSIS — N644 Mastodynia: Secondary | ICD-10-CM

## 2023-07-29 DIAGNOSIS — Z124 Encounter for screening for malignant neoplasm of cervix: Secondary | ICD-10-CM

## 2023-07-29 DIAGNOSIS — Z1211 Encounter for screening for malignant neoplasm of colon: Secondary | ICD-10-CM

## 2023-07-29 NOTE — Progress Notes (Signed)
Jessica Moore is a 57 y.o. [phone] female who presents to [facility] clinic today with complaints of left breast pain.    Pap Smear: Pap smear completed today. Last Pap smear was 12/09/2019 at [facility] clinic and was normal. Per patient has no history of an abnormal Pap smear. Last Pap smear result is available in Epic.   Physical exam: Breasts Breasts symmetrical. No skin abnormalities bilateral breasts. No nipple retraction bilateral breasts. No nipple discharge bilateral breasts. No lymphadenopathy. No lumps palpated bilateral breasts.   MS DIGITAL DIAG TOMO UNI LEFT  Result Date: 06/28/2022 CLINICAL DATA:  Left breast calcifications on a recent screening mammogram. EXAM: DIGITAL DIAGNOSTIC UNILATERAL LEFT MAMMOGRAM WITH TOMOSYNTHESIS TECHNIQUE: Left digital diagnostic mammography and breast tomosynthesis was performed. COMPARISON:  Previous exam(s). ACR Breast Density Category c: The breast tissue is heterogeneously dense, which may obscure small masses. FINDINGS: There are 2 small, oval, circumscribed calcifications in the upper inner left breast, middle depth. No findings suspicious for malignancy. IMPRESSION: 1. Two small, benign left breast calcifications. 2. No evidence of malignancy. RECOMMENDATION: Bilateral screening mammogram in 1 year. I have discussed the findings and recommendations with the patient. If applicable, a reminder letter will be sent to the patient regarding the next appointment. BI-RADS CATEGORY  2: Benign. Electronically Signed   By: Beckie Salts M.D.   On: 06/28/2022 16:40  MS DIGITAL SCREENING TOMO BILATERAL  Result Date: 06/25/2022 CLINICAL DATA:  Screening. EXAM: DIGITAL SCREENING BILATERAL MAMMOGRAM WITH TOMOSYNTHESIS AND CAD TECHNIQUE: Bilateral screening digital craniocaudal and mediolateral oblique mammograms were obtained. Bilateral screening digital breast tomosynthesis was performed. The images were evaluated with computer-aided detection. COMPARISON:   Previous exam(s). ACR Breast Density Category c: The breast tissue is heterogeneously dense, which may obscure small masses. FINDINGS: In the left breast, calcifications warrant further evaluation. In the right breast, no findings suspicious for malignancy. IMPRESSION: Further evaluation is suggested for calcifications in the left breast. RECOMMENDATION: Diagnostic mammogram of the left breast. (Code:FI-L-15M) The patient will be contacted regarding the findings, and additional imaging will be scheduled. BI-RADS CATEGORY  0: Incomplete. Need additional imaging evaluation and/or prior mammograms for comparison. Electronically Signed   By: Baird Lyons M.D.   On: 06/25/2022 15:42   MM DIAG BREAST TOMO UNI RIGHT  Result Date: 12/28/2021 CLINICAL DATA:  BI-RADS 3 follow-up of a RIGHT breast mass, initiated September 2022. history of a biopsy-proven benign area of [place] in 2019. EXAM: DIGITAL DIAGNOSTIC UNILATERAL RIGHT MAMMOGRAM WITH TOMOSYNTHESIS AND CAD; ULTRASOUND RIGHT BREAST LIMITED TECHNIQUE: Right digital diagnostic mammography and breast tomosynthesis was performed. The images were evaluated with computer-aided detection.; Targeted ultrasound examination of the right breast was performed COMPARISON:  Previous exam(s). ACR Breast Density Category c: The breast tissue is heterogeneously dense, which may obscure small masses. FINDINGS: Diagnostic images of the RIGHT breast demonstrate an oval circumscribed mass in the RIGHT outer breast at middle to posterior depth. It is minimally increased in size in comparison to prior. No additional suspicious findings are noted in the RIGHT breast. On physical exam, no suspicious mass is appreciated. Targeted ultrasound was performed of the RIGHT outer breast. At 9 o'clock 6 cm from the nipple, there is an oval circumscribed anechoic mass with posterior acoustic enhancement. It measures 10 x 13 x 6 mm and is consistent with a benign simple cyst. IMPRESSION: There is a benign  cyst in the RIGHT breast at 9 o'clock 6 cm from the nipple. As such, recommend return to annual screening mammography,  due September 2023. RECOMMENDATION: Bilateral screening mammogram, due September 2023. I have discussed the findings and recommendations with the patient. If applicable, a reminder letter will be sent to the patient regarding the next appointment. BI-RADS CATEGORY  2: Benign. Electronically Signed   By: Meda Klinefelter M.D.   On: 12/28/2021 15:45  MM DIAG BREAST TOMO BILATERAL  Result Date: 06/29/2021 CLINICAL DATA:  Follow-up of probably benign right breast 1 o'clock mass, first seen in December 2019. History of benign core needle biopsy of right breast 3 o'clock mass. EXAM: DIGITAL DIAGNOSTIC BILATERAL MAMMOGRAM WITH TOMOSYNTHESIS AND CAD; ULTRASOUND RIGHT BREAST LIMITED TECHNIQUE: Bilateral digital diagnostic mammography and breast tomosynthesis was performed. The images were evaluated with computer-aided detection.; Targeted ultrasound examination of the right breast was performed COMPARISON:  Previous exam(s). ACR Breast Density Category c: The breast tissue is heterogeneously dense, which may obscure small masses. FINDINGS: Mammographically, there are no suspicious masses, areas of architectural distortion or microcalcifications in either breast. There is a 9 mm circumscribed mass in the slightly upper outer right breast, middle depth, with benign appearance. Targeted right breast ultrasound is performed, demonstrating stable benign-appearing mass in the right breast 1 o'clock 6 cm from the nipple measuring 1.0 x 0.3 x 0.5 cm. In the right breast 9 o'clock 6 cm from nipple there is a similar appearing hypoechoic circumscribed horizontally oriented benign-appearing mass measuring 1.0 x 0.5 x 0.9 cm. This last finding corresponds to the mammographically seen mass. IMPRESSION: Benign right breast 1 o'clock mass. Given the 2 year stability, no further imaging follow-up is recommended for  this finding. Probably benign right breast 9 o'clock mass, for which short-term imaging follow-up is recommended. RECOMMENDATION: Right diagnostic mammogram and focused right breast ultrasound in 6 months. I have discussed the findings and recommendations with the patient. If applicable, a reminder letter will be sent to the patient regarding the next appointment. BI-RADS CATEGORY  3: Probably benign. Electronically Signed   By: Ted Mcalpine M.D.   On: 06/29/2021 15:54  MM DIAG BREAST TOMO BILATERAL  Result Date: 09/18/2019 CLINICAL DATA:  Delayed follow-up of a probable benign right breast mass at 1 o'clock. Patient had a benign biopsy of the right breast in the 3 o'clock position on 09/08/2018. EXAM: DIGITAL DIAGNOSTIC BILATERAL MAMMOGRAM WITH CAD AND TOMO ULTRASOUND RIGHT BREAST COMPARISON:  Previous exam(s). ACR Breast Density Category c: The breast tissue is heterogeneously dense, which may obscure small masses. FINDINGS: No suspicious mass, malignant type microcalcifications or distortion detected in either breast. Mammographic images were processed with CAD. Targeted ultrasound is performed, showing a stable hypoechoic mass in the right breast at 1 o'clock 6 cm from the nipple measuring 1.0 x 0.3 x 0.5 cm. On the prior ultrasound dated 09/02/2018 it measured 1.0 x 0.3 x 0.6 cm. IMPRESSION: Stable probable benign mass in the 1 o'clock region of the right breast. RECOMMENDATION: Bilateral diagnostic mammogram and right breast ultrasound in 1 year is recommended to document stability of the mass in the 1 o'clock region of the right breast for 2 years. I have discussed the findings and recommendations with the patient. If applicable, a reminder letter will be sent to the patient regarding the next appointment. BI-RADS CATEGORY  3: Probably benign. Electronically Signed   By: Baird Lyons M.D.   On: 09/18/2019 14:23   MM CLIP PLACEMENT RIGHT  Result Date: 09/08/2018 CLINICAL DATA:  Patient with  RIGHT breast mass presents today for ultrasound-guided core biopsy. EXAM: DIAGNOSTIC RIGHT MAMMOGRAM POST ULTRASOUND  BIOPSY COMPARISON:  Previous exam(s). FINDINGS: Mammographic images were obtained following ultrasound guided biopsy of the RIGHT breast mass at the 3 o'clock axis. Heart shaped clip is appropriately positioned, corresponding to the additional mammographic finding. IMPRESSION: Heart shaped clip is appropriately positioned within the RIGHT breast at the site of the targeted mass at the 3 o'clock axis, corresponding to the original mammographic finding. Final Assessment: Post Procedure Mammograms for Marker Placement Electronically Signed   By: Bary Richard M.D.   On: 09/08/2018 09:11   MM DIAG BREAST TOMO UNI RIGHT  Result Date: 09/02/2018 CLINICAL DATA:  Screening recall for asymmetry seen in the inner right breast on the CC view only. EXAM: DIGITAL DIAGNOSTIC UNILATERAL RIGHT MAMMOGRAM WITH CAD AND TOMO RIGHT BREAST ULTRASOUND COMPARISON:  Mammogram dated 07/10/2018. ACR Breast Density Category c: The breast tissue is heterogeneously dense, which may obscure small masses. FINDINGS: Additional tomograms were performed of the right breast. The initially questioned possible right breast asymmetry is felt to persist although less conspicuous on the additional imaging with several small oval circumscribed masses seen in the inner right breast on the CC views. Mammographic images were processed with CAD. Physical examination of the upper inner right breast does not reveal any palpable masses. Targeted ultrasound of the right breast was performed demonstrating an oval circumscribed gently lobulated mass at 1 o'clock 6 cm from nipple measuring 1 x 0.3 x 0.6 cm and felt to represent a cluster of complicated cysts (note that there are 2 sets of sonographic images for this mass). This is felt to correspond with the asymmetry initially seen on screening mammography. Numerous smaller cysts are seen in the  upper inner quadrant of the right breast, with a cyst at 2 o'clock 8 cm from nipple measuring 0.5 x 0.3 x 0.4 cm. There is an irregular hypoechoic mass in the right breast at the 3 o'clock position 6 cm from nipple measuring 0.5 x 0.5 x 0.7 cm (note that there are 2 sets of sonographic images for this mass). No definite right axillary lymphadenopathy seen. A lymph node with a borderline thickened cortex and preserved fatty hilum is felt to be within normal limits for the patient and similar in appearance when compared to the left axilla. IMPRESSION: 1. Indeterminate mass in the right breast at the 3 o'clock position 6 cm from nipple. 2. Probably benign mass in the right breast at 1 o'clock 6 cm from nipple felt to represent a cluster of complicated cysts. RECOMMENDATION: 1. Recommend ultrasound-guided biopsy of the mass in the right breast at the 3 o'clock position. 2. If results from the biopsy of the mass in the right breast at the 3 o'clock position return as benign, then recommend six-month follow-up right breast mammogram and ultrasound to demonstrate stability of probable cluster of complicated cysts in the right breast at the 1 o'clock position. If biopsy results return as abnormal/surgical, then recommend the patient return for biopsy of this mass at the 3 o'clock position. I have discussed the findings and recommendations with the patient. Results were also provided in writing at the conclusion of the visit. If applicable, a reminder letter will be sent to the patient regarding the next appointment. BI-RADS CATEGORY  4: Suspicious. Electronically Signed   By: Edwin Cap M.D.   On: 09/02/2018 10:15        Pelvic/Bimanual Ext Genitalia No lesions, no swelling and no discharge observed on external genitalia.        Vagina Vagina pink and normal  texture. No lesions or discharge observed in vagina.        Cervix Cervix is present. Cervix pink and of normal texture. No discharge observed.     Uterus Uterus is present and palpable. Uterus in normal position and normal size.        Adnexae Bilateral ovaries present and palpable. No tenderness on palpation.         Rectovaginal No rectal exam completed today since patient had no rectal complaints. No skin abnormalities observed on exam.     Smoking History: Patient has never smoked and was not referred to quit line.    Patient Navigation: Patient education provided. Access to services provided for patient through [facility] program. No interpreter provided. No transportation provided   Colorectal Cancer Screening: Per patient had colonoscopy on 03/25/2018 with results colon polyp, sigmoid - Hyperplastic polyp, negative for dysplasia and malignancy.  No complaints today. FIT test given.    Breast and Cervical Cancer Risk Assessment: Patient does not have family history of breast cancer, known genetic mutations, or radiation treatment to the chest before age 70. Patient does not have history of cervical dysplasia, immunocompromised, or DES exposure in-utero.  Risk Scores as of Encounter on 07/29/2023     Dondra Spry           5-year 1.09%   Lifetime 5.47%            Last calculated by Narda Rutherford, LPN on 65/78/4696 at  9:12 AM        A: [facility] exam with pap smear Complaint of left breast pain.  P: Referred patient to the [facility] for a diagnostic mammogram. Appointment scheduled 07/29/2023.  Pascal Lux, NP 07/29/2023 9:29 AM

## 2023-07-29 NOTE — Patient Instructions (Addendum)
Taught Jessica Moore about self breast awareness and gave educational materials to take home. Patient did need a Pap smear today due to last Pap smear was in 2021 per patient. Let her know [facility] will cover Pap smears every 5 years unless has a history of abnormal Pap smears. Referred patient to the [facility] for diagnostic mammogram. Appointment scheduled for 07/29/2023. Patient aware of appointment and will be there. Let patient know will follow up with her within the next couple weeks with results. Jessica Moore verbalized understanding.  Pascal Lux, NP 8:52 AM

## 2023-07-31 LAB — CYTOLOGY - PAP
Adequacy: ABSENT
Comment: NEGATIVE
Diagnosis: NEGATIVE
High risk HPV: NEGATIVE

## 2023-08-01 ENCOUNTER — Other Ambulatory Visit: Payer: Self-pay

## 2023-08-01 ENCOUNTER — Telehealth: Payer: Self-pay

## 2023-08-01 DIAGNOSIS — R928 Other abnormal and inconclusive findings on diagnostic imaging of breast: Secondary | ICD-10-CM

## 2023-08-01 NOTE — Telephone Encounter (Signed)
Patient informed negative Pap/HPV results. Patient verbalized understanding.  

## 2023-08-02 LAB — FECAL OCCULT BLOOD, IMMUNOCHEMICAL: Fecal Occult Bld: NEGATIVE

## 2023-08-05 ENCOUNTER — Other Ambulatory Visit: Payer: Self-pay | Admitting: Obstetrics and Gynecology

## 2023-08-05 DIAGNOSIS — R928 Other abnormal and inconclusive findings on diagnostic imaging of breast: Secondary | ICD-10-CM

## 2023-10-22 DIAGNOSIS — R7309 Other abnormal glucose: Secondary | ICD-10-CM | POA: Diagnosis not present

## 2023-10-22 DIAGNOSIS — I10 Essential (primary) hypertension: Secondary | ICD-10-CM | POA: Diagnosis not present

## 2023-10-22 DIAGNOSIS — R635 Abnormal weight gain: Secondary | ICD-10-CM | POA: Diagnosis not present

## 2023-10-22 DIAGNOSIS — N951 Menopausal and female climacteric states: Secondary | ICD-10-CM | POA: Diagnosis not present

## 2023-10-23 ENCOUNTER — Telehealth: Payer: Self-pay | Admitting: *Deleted

## 2023-10-28 DIAGNOSIS — D1720 Benign lipomatous neoplasm of skin and subcutaneous tissue of unspecified limb: Secondary | ICD-10-CM | POA: Diagnosis not present

## 2023-10-28 DIAGNOSIS — I10 Essential (primary) hypertension: Secondary | ICD-10-CM | POA: Diagnosis not present

## 2023-10-28 DIAGNOSIS — N951 Menopausal and female climacteric states: Secondary | ICD-10-CM | POA: Diagnosis not present

## 2023-10-28 DIAGNOSIS — F411 Generalized anxiety disorder: Secondary | ICD-10-CM | POA: Diagnosis not present

## 2023-10-28 DIAGNOSIS — Z0000 Encounter for general adult medical examination without abnormal findings: Secondary | ICD-10-CM | POA: Diagnosis not present

## 2023-10-28 DIAGNOSIS — E8881 Metabolic syndrome: Secondary | ICD-10-CM | POA: Diagnosis not present

## 2023-10-28 DIAGNOSIS — E782 Mixed hyperlipidemia: Secondary | ICD-10-CM | POA: Diagnosis not present

## 2024-01-07 DIAGNOSIS — E012 Iodine-deficiency related (endemic) goiter, unspecified: Secondary | ICD-10-CM | POA: Diagnosis not present

## 2024-01-07 DIAGNOSIS — M1990 Unspecified osteoarthritis, unspecified site: Secondary | ICD-10-CM | POA: Diagnosis not present

## 2024-01-07 DIAGNOSIS — Z833 Family history of diabetes mellitus: Secondary | ICD-10-CM | POA: Diagnosis not present

## 2024-01-07 DIAGNOSIS — Z7984 Long term (current) use of oral hypoglycemic drugs: Secondary | ICD-10-CM | POA: Diagnosis not present

## 2024-01-07 DIAGNOSIS — Z9181 History of falling: Secondary | ICD-10-CM | POA: Diagnosis not present

## 2024-01-07 DIAGNOSIS — Z8249 Family history of ischemic heart disease and other diseases of the circulatory system: Secondary | ICD-10-CM | POA: Diagnosis not present

## 2024-01-07 DIAGNOSIS — Z008 Encounter for other general examination: Secondary | ICD-10-CM | POA: Diagnosis not present

## 2024-01-07 DIAGNOSIS — I499 Cardiac arrhythmia, unspecified: Secondary | ICD-10-CM | POA: Diagnosis not present

## 2024-01-07 DIAGNOSIS — E785 Hyperlipidemia, unspecified: Secondary | ICD-10-CM | POA: Diagnosis not present

## 2024-01-07 DIAGNOSIS — E1151 Type 2 diabetes mellitus with diabetic peripheral angiopathy without gangrene: Secondary | ICD-10-CM | POA: Diagnosis not present

## 2024-01-07 DIAGNOSIS — F325 Major depressive disorder, single episode, in full remission: Secondary | ICD-10-CM | POA: Diagnosis not present

## 2024-01-07 DIAGNOSIS — I10 Essential (primary) hypertension: Secondary | ICD-10-CM | POA: Diagnosis not present

## 2024-01-07 DIAGNOSIS — F411 Generalized anxiety disorder: Secondary | ICD-10-CM | POA: Diagnosis not present

## 2024-01-20 DIAGNOSIS — E782 Mixed hyperlipidemia: Secondary | ICD-10-CM | POA: Diagnosis not present

## 2024-01-27 DIAGNOSIS — E8881 Metabolic syndrome: Secondary | ICD-10-CM | POA: Diagnosis not present

## 2024-01-27 DIAGNOSIS — M545 Low back pain, unspecified: Secondary | ICD-10-CM | POA: Diagnosis not present

## 2024-01-27 DIAGNOSIS — F411 Generalized anxiety disorder: Secondary | ICD-10-CM | POA: Diagnosis not present

## 2024-01-27 DIAGNOSIS — R7303 Prediabetes: Secondary | ICD-10-CM | POA: Diagnosis not present

## 2024-01-27 DIAGNOSIS — I10 Essential (primary) hypertension: Secondary | ICD-10-CM | POA: Diagnosis not present

## 2024-01-28 ENCOUNTER — Ambulatory Visit
Admission: RE | Admit: 2024-01-28 | Discharge: 2024-01-28 | Disposition: A | Discharge status: Home / Self Care | Payer: 59 | Source: Ambulatory Visit | Attending: Obstetrics and Gynecology | Admitting: Obstetrics and Gynecology

## 2024-01-28 DIAGNOSIS — R928 Other abnormal and inconclusive findings on diagnostic imaging of breast: Secondary | ICD-10-CM

## 2024-01-29 ENCOUNTER — Other Ambulatory Visit: Payer: Self-pay | Admitting: Obstetrics and Gynecology

## 2024-01-29 DIAGNOSIS — R928 Other abnormal and inconclusive findings on diagnostic imaging of breast: Secondary | ICD-10-CM

## 2024-04-29 ENCOUNTER — Other Ambulatory Visit: Payer: Self-pay

## 2024-04-29 ENCOUNTER — Other Ambulatory Visit: Payer: Self-pay | Admitting: Obstetrics and Gynecology

## 2024-04-29 DIAGNOSIS — N644 Mastodynia: Secondary | ICD-10-CM

## 2024-04-29 DIAGNOSIS — N63 Unspecified lump in unspecified breast: Secondary | ICD-10-CM

## 2024-05-26 DIAGNOSIS — I10 Essential (primary) hypertension: Secondary | ICD-10-CM | POA: Diagnosis not present

## 2024-05-26 DIAGNOSIS — R7303 Prediabetes: Secondary | ICD-10-CM | POA: Diagnosis not present

## 2024-05-29 DIAGNOSIS — F411 Generalized anxiety disorder: Secondary | ICD-10-CM | POA: Diagnosis not present

## 2024-05-29 DIAGNOSIS — I10 Essential (primary) hypertension: Secondary | ICD-10-CM | POA: Diagnosis not present

## 2024-05-29 DIAGNOSIS — N951 Menopausal and female climacteric states: Secondary | ICD-10-CM | POA: Diagnosis not present

## 2024-05-29 DIAGNOSIS — R7303 Prediabetes: Secondary | ICD-10-CM | POA: Diagnosis not present

## 2024-05-29 DIAGNOSIS — D1720 Benign lipomatous neoplasm of skin and subcutaneous tissue of unspecified limb: Secondary | ICD-10-CM | POA: Diagnosis not present

## 2024-05-29 DIAGNOSIS — E782 Mixed hyperlipidemia: Secondary | ICD-10-CM | POA: Diagnosis not present

## 2024-08-04 ENCOUNTER — Other Ambulatory Visit: Payer: Self-pay | Admitting: Internal Medicine

## 2024-08-04 ENCOUNTER — Encounter: Payer: Self-pay | Admitting: Internal Medicine

## 2024-08-04 ENCOUNTER — Encounter: Payer: Self-pay | Admitting: Family Medicine

## 2024-08-04 ENCOUNTER — Ambulatory Visit

## 2024-08-04 DIAGNOSIS — N644 Mastodynia: Secondary | ICD-10-CM

## 2024-08-04 DIAGNOSIS — N63 Unspecified lump in unspecified breast: Secondary | ICD-10-CM

## 2024-08-06 ENCOUNTER — Ambulatory Visit

## 2024-09-08 ENCOUNTER — Ambulatory Visit
Admission: RE | Admit: 2024-09-08 | Discharge: 2024-09-08 | Disposition: A | Source: Ambulatory Visit | Attending: Internal Medicine | Admitting: Internal Medicine

## 2024-09-08 DIAGNOSIS — N63 Unspecified lump in unspecified breast: Secondary | ICD-10-CM | POA: Diagnosis not present

## 2024-09-08 DIAGNOSIS — R928 Other abnormal and inconclusive findings on diagnostic imaging of breast: Secondary | ICD-10-CM | POA: Diagnosis not present

## 2024-09-08 DIAGNOSIS — R92333 Mammographic heterogeneous density, bilateral breasts: Secondary | ICD-10-CM | POA: Diagnosis not present

## 2024-09-08 DIAGNOSIS — N644 Mastodynia: Secondary | ICD-10-CM | POA: Diagnosis not present

## 2024-09-08 DIAGNOSIS — N6322 Unspecified lump in the left breast, upper inner quadrant: Secondary | ICD-10-CM | POA: Diagnosis not present

## 2024-09-11 ENCOUNTER — Other Ambulatory Visit: Payer: Self-pay | Admitting: Internal Medicine

## 2024-09-11 DIAGNOSIS — R928 Other abnormal and inconclusive findings on diagnostic imaging of breast: Secondary | ICD-10-CM

## 2024-09-15 ENCOUNTER — Inpatient Hospital Stay: Admission: RE | Admit: 2024-09-15 | Discharge: 2024-09-15 | Attending: Internal Medicine | Admitting: Internal Medicine

## 2024-09-15 DIAGNOSIS — C50212 Malignant neoplasm of upper-inner quadrant of left female breast: Secondary | ICD-10-CM | POA: Diagnosis not present

## 2024-09-15 DIAGNOSIS — R928 Other abnormal and inconclusive findings on diagnostic imaging of breast: Secondary | ICD-10-CM

## 2024-09-15 DIAGNOSIS — N6322 Unspecified lump in the left breast, upper inner quadrant: Secondary | ICD-10-CM | POA: Diagnosis not present

## 2024-09-15 HISTORY — PX: BREAST BIOPSY: SHX20

## 2024-09-15 MED ORDER — LIDOCAINE 1 % OPTIME INJ - NO CHARGE
2.0000 mL | Freq: Once | INTRAMUSCULAR | Status: AC
  Administered 2024-09-15: 08:00:00 2 mL via INTRADERMAL
  Filled 2024-09-15: qty 2

## 2024-09-15 MED ORDER — LIDOCAINE-EPINEPHRINE 1 %-1:100000 IJ SOLN
10.0000 mL | Freq: Once | INTRAMUSCULAR | Status: AC
  Administered 2024-09-15: 08:00:00 10 mL via INTRADERMAL

## 2024-09-16 ENCOUNTER — Encounter: Payer: Self-pay | Admitting: *Deleted

## 2024-09-16 LAB — SURGICAL PATHOLOGY

## 2024-09-16 NOTE — Progress Notes (Signed)
 Received referral for newly diagnosed breast cancer from [facility].  Navigation initiated.  Ms. Jessica Moore would like to go to [facility] breast clinic.   Demographics, pathology report and mammogram report faxed to [facility].   Ms. Jessica Moore given the number to call if she doesn't here from them to schedule in the next couple days.

## 2024-10-13 ENCOUNTER — Telehealth: Payer: Self-pay

## 2024-10-13 NOTE — Telephone Encounter (Signed)
 Patient, a previous [facility] patient, called and stated she was very discouraged as she was diagnosed with Breast cancer in 08/2024. She initially wanted to have treatment at [facility], but was told her insurance [facility] [employer]) was not accepted. Patient was not satisfied with insurance coverage as she is not able to be seen anywhere she prefers, and per her pcp's recommendations. Her pcp is with [facility] and recommended [facility]. Patient to end coverage with current insurance and apply for [facility] as she meets the eligibility criteria.  Patient only wants to be seen by a female oncologist, and as soon as possible.She is worried it will get worse before she can start treatment. Patient informed will send message to [place], RN Navigator @ [facility]. Patient was reassured and verbalized understanding.

## 2024-10-14 ENCOUNTER — Encounter: Payer: Self-pay | Admitting: *Deleted

## 2024-10-14 DIAGNOSIS — C50919 Malignant neoplasm of unspecified site of unspecified female breast: Secondary | ICD-10-CM

## 2024-10-14 NOTE — Progress Notes (Signed)
 Jessica Moore insurance was not accepted at [facility] so she would like to be seen here.   She also would like to see a surgeon at [facility], referral has been sent to their office.  She will see Dr. Melanee this Friday at 10:45.

## 2024-10-16 ENCOUNTER — Encounter: Payer: Self-pay | Admitting: Oncology

## 2024-10-16 ENCOUNTER — Encounter: Payer: Self-pay | Admitting: *Deleted

## 2024-10-16 ENCOUNTER — Other Ambulatory Visit: Payer: Self-pay | Admitting: *Deleted

## 2024-10-16 ENCOUNTER — Ambulatory Visit
Admission: RE | Admit: 2024-10-16 | Discharge: 2024-10-16 | Disposition: A | Discharge status: Home / Self Care | Payer: Self-pay | Attending: Oncology | Admitting: Oncology

## 2024-10-16 ENCOUNTER — Ambulatory Visit
Admission: RE | Admit: 2024-10-16 | Discharge: 2024-10-16 | Disposition: A | Discharge status: Home / Self Care | Payer: Self-pay | Source: Ambulatory Visit | Attending: Oncology | Admitting: Oncology

## 2024-10-16 ENCOUNTER — Inpatient Hospital Stay: Payer: Self-pay

## 2024-10-16 ENCOUNTER — Inpatient Hospital Stay: Payer: Self-pay | Attending: Oncology | Admitting: Oncology

## 2024-10-16 VITALS — BP 110/96 | HR 85 | Temp 97.0°F | Resp 18 | Ht 67.0 in | Wt 179.0 lb

## 2024-10-16 DIAGNOSIS — M25512 Pain in left shoulder: Secondary | ICD-10-CM | POA: Insufficient documentation

## 2024-10-16 DIAGNOSIS — C50412 Malignant neoplasm of upper-outer quadrant of left female breast: Secondary | ICD-10-CM | POA: Insufficient documentation

## 2024-10-16 DIAGNOSIS — Z170 Estrogen receptor positive status [ER+]: Secondary | ICD-10-CM | POA: Insufficient documentation

## 2024-10-16 DIAGNOSIS — C50212 Malignant neoplasm of upper-inner quadrant of left female breast: Secondary | ICD-10-CM | POA: Insufficient documentation

## 2024-10-16 DIAGNOSIS — Z7189 Other specified counseling: Secondary | ICD-10-CM

## 2024-10-16 LAB — GENETIC SCREENING ORDER

## 2024-10-16 NOTE — Progress Notes (Signed)
 New patient; New Breast PT Invasive Carcinoma of breast.

## 2024-10-16 NOTE — Progress Notes (Addendum)
 "  Hematology/Oncology Consult note [facility] Telephone:([phone] [phone]  Patient Care Team: Sherial Bail, MD as PCP - General (Internal Medicine) Driscilla Wanda SQUIBB, RN   Name of the patient: Jessica Moore  [id number]  [birth year]    Reason for referral-new diagnosis of breast cancer   Referring physician-Dr. Sherial  Date of visit: 10/16/24   History of presenting illness- Patient is a 59 year old female with a past medical history significant for hypertension hyperlipidemia among other medical problems.  She underwent a diagnostic mammogram in December 2025 after she was found to have a benign left breast asymmetry prior.  Mammogram showed indeterminate left breast mass at the 10 o'clock position measuring 10 x 5 x 4 mm.  No interval change in the appearance of probable benign left breast mass/focal asymmetry on the 87-month follow-up.  1 more year follow-up recommended for this mass to ensure 2-year stability.  No mammographic evidence of malignancy in the right breast.  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  Ki-67 15%  Overall she is doing well presently and denies any complaints at this time.  Menarche at 65.  She is G3 P3.  Age at first birth 50.  She used birth control pills in the past.  She is menopausal and last.  Was several years ago.  She had right breast biopsy back in 2019.  No family history of breast cancer.  History of prostate cancer in her brother.  She experiences persistent left arm and shoulder pain, which began after her cancer diagnosis. The pain started during a vacation, described as aching and sometimes severe, radiating from the neck and above the shoulders down both arms, but is now primarily localized to the left arm and shoulder. The pain  limits arm elevation, disrupts sleep, and is occasionally associated with muscle spasm. She denies any specific injury. She takes tizanidine at night for pain, as previous muscle relaxants were ineffective. The pain has persisted for several weeks.  She expressed interest in genetic testing due to her breast cancer diagnosis and a family history of prostate cancer in her brother. She has no known family history of breast or ovarian cancer in her parents or siblings.       ECOG PS- 0  Pain scale- 3   Review of systems- Review of Systems  Constitutional:  Negative for chills, fever, malaise/fatigue and weight loss.  HENT:  Negative for congestion, ear discharge and nosebleeds.   Eyes:  Negative for blurred vision.  Respiratory:  Negative for cough, hemoptysis, sputum production, shortness of breath and wheezing.   Cardiovascular:  Negative for chest pain, palpitations, orthopnea and claudication.  Gastrointestinal:  Negative for abdominal pain, blood in stool, constipation, diarrhea, heartburn, melena, nausea and vomiting.  Genitourinary:  Negative for dysuria, flank pain, frequency, hematuria and urgency.  Musculoskeletal:  Negative for back pain, joint pain and myalgias.  Skin:  Negative for rash.  Neurological:  Negative for dizziness, tingling, focal weakness, seizures, weakness and headaches.  Endo/Heme/Allergies:  Does not bruise/bleed easily.  Psychiatric/Behavioral:  Negative for depression and suicidal ideas. The patient does not have insomnia.     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   Thyroid  nodule 04/23/2019   Benign hypertension 06/30/2018   Goiter 06/30/2018   Uterine fibroid 06/30/2018   Perimenopausal symptom 06/30/2018     Past Medical  History:  Diagnosis Date   Hypertension      Past Surgical History:  Procedure Laterality Date   BREAST BIOPSY     BREAST BIOPSY Left 09/15/2024   US  LT BREAST BX W LOC DEV 1ST LESION IMG BX SPEC US  GUIDE 09/15/2024 [facility]   COLONOSCOPY WITH PROPOFOL  N/A 03/25/2018   Procedure: COLONOSCOPY WITH PROPOFOL ;  Surgeon: Therisa Bi, MD;  Location[facility] END[facility]COPY;  Service: Gastroenterology;  Laterality: N/A;   HAND TENDON SURGERY Right 1985   Pinky Finger   THYROIDECTOMY, PARTIAL Left 04/08/2019   Dr. Jerilynn   UTERINE FIBROID EMBOLIZATION      Social History   Socioeconomic History   Marital status: Married    Spouse name: Ozell   Number of children: 3   Years of education: Not on file   Highest education level: Associate degree: academic program  Occupational History   Occupation: [employer]    Comment: Not currently working  Tobacco Use   Smoking status: Never   Smokeless tobacco: Never  Vaping Use   Vaping status: Never Used  Substance and Sexual Activity   Alcohol use: Yes    Comment: occass   Drug use: No   Sexual activity: Yes    Partners: Male    Birth control/protection: None  Other Topics Concern   Not on file  Social History Narrative   She is originally from [place]  in 2014 because of her current husband, they got married in 2015   She has 3 grown children still in [place] .    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 , he also works as energy manager - placing generators    Social Drivers of Health   Tobacco Use: Low Risk  (10/09/2024)   Received from [facility]   Patient History    Smoking Tobacco Use: Never    Smokeless Tobacco Use: Never    Passive Exposure: Not on file  Financial Resource Strain: Low Risk  (05/29/2024)   Received from [facility]   Overall Financial Resource Strain (CARDIA)    Difficulty of Paying Living Expenses: Not hard at  all  Food Insecurity: No Food Insecurity (05/29/2024)   Received from [facility]   Epic    Within the past 12 months, you worried that your food would run out before you got the money to buy more.: Never true    Within the past 12 months, the food you bought just didn't last and you didn't have money to get more.: Never true  Transportation Needs: No Transportation Needs (05/29/2024)   Received from [facility] - Transportation    In the past 12 months, has lack of transportation kept you from medical appointments or from getting medications?: No    Lack of Transportation (Non-Medical): No  Physical Activity: Not on file  Stress: Not on file  Social Connections: Not on file  Intimate Partner Violence: Not on file  Depression (EYV7-0): Medium Risk (11/02/2021)   Depression (PHQ2-9)    PHQ-2 Score: 10  Alcohol Screen: Not on file  Housing: Low Risk  (05/29/2024)   Received from [facility]  In the last 12 months, was there a time when you were not able to pay the mortgage or rent on time?: No    In the past 12 months, how many times have you moved where you were living?: 0    At any time in the past 12 months, were you homeless or living in a shelter (including now)?: No  Utilities: Not At Risk (05/29/2024)   Received from [facility]   Epic    In the past 12 months has the electric, gas, oil, or water company threatened to shut off services in your home?: No  Health Literacy: Not on file     Family History  Problem Relation Age of Onset   Diabetes Mother    Obesity Mother    Hypertension Maternal Grandmother    Alcohol abuse Maternal Grandmother    Obesity Son    Stroke Son    Heart failure Son    Breast cancer Neg Hx     Current Medications[2]   Physical exam:  Vitals:   10/16/24 1101  BP: (!) 110/96  Pulse: 85  Resp: 18  Temp: (!) 97 F (36.1 C)  TempSrc: Tympanic  SpO2: 100%   Weight: 179 lb (81.2 kg)  Height: 5' 7 (1.702 m)   Physical Exam Cardiovascular:     Rate and Rhythm: Normal rate and regular rhythm.     Heart sounds: Normal heart sounds.  Pulmonary:     Effort: Pulmonary effort is normal.     Breath sounds: Normal breath sounds.  Abdominal:     General: Bowel sounds are normal.     Palpations: Abdomen is soft.  Skin:    General: Skin is warm and dry.  Neurological:     Mental Status: She is alert and oriented to person, place, and time.   Breast exam: No palpable masses in either breast.  No palpable bilateral axillar adenopathy       Latest Ref Rng & Units 08/30/2022   10:24 AM  CMP  Creatinine 0.44 - 1.00 mg/dL 9.09       Latest Ref Rng & Units 09/18/2019   12:00 AM  CBC  WBC 3.8 - 10.8 Thousand/uL 5.5   Hemoglobin 11.7 - 15.5 g/dL 87.2   Hematocrit 64.9 - 45.0 % 38.3   Platelets 140 - 400 Thousand/uL 277       Assessment and plan- Patient is a 59 y.o. 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 Prognosis is excellent. Chemotherapy need will be determined by Oncotype DX, though likelihood is low given tumor biology. - Referred to breast surgery for lumpectomy and sentinel lymph node biopsy. - Planned post-lumpectomy radiation therapy for local control, to be coordinated with radiation oncology. - Planned adjuvant antiestrogen therapy (aromatase inhibitor or similar) for at least 5 years, pending bone density assessment. Discussed potential side effects: fatigue,  hot flashes, arthralgias, mood changes, vaginal dryness, increased cholesterol, cardiovascular risk, and bone loss. Will monitor bone density prior to initiation and periodically thereafter. Will consider alternative antiestrogen agents if  intolerant. - Planned Oncotype DX testing on surgical specimen to determine need for adjuvant chemotherapy; chemotherapy is unlikely but will await results. - Provided education regarding surveillance with annual mammogram and clinical follow-up every 4-6 months while on antiestrogen therapy. - Advised against routine whole-body imaging or tumor marker monitoring unless new symptoms arise.  Genetic risk assessment for hereditary breast cancer Meets criteria for genetic risk assessment based on personal history of breast cancer. Family history includes brother with prostate cancer, but no known hereditary cancer syndrome. Genetic testing will inform risk of future malignancies and implications for family members. - Ordered comprehensive hereditary cancer genetic testing (blood test). - Referred to genetic counseling for interpretation of results and discussion of implications.  Left shoulder pain Several weeks of left shoulder pain, likely musculoskeletal, without trauma and not suspected to be related to malignancy or biopsy. Symptoms include aching, limited range of motion, and muscle spasm. Currently using tizanidine at night with limited benefit. - Ordered left shoulder x-ray to evaluate for musculoskeletal or joint pathology prior to upcoming primary care visit. - Advised follow-up with primary care for further management based on x-ray results and clinical course.       Cancer Staging  Malignant neoplasm of upper-outer quadrant of left breast in female, estrogen receptor positive (HCC) Staging form: Breast, AJCC 8th Edition - Clinical stage from 10/16/2024: Stage IA (cT1b, cN0, cM0, G2, ER+, PR+, HER2-) - Signed by Melanee Annah BROCKS, MD on 10/16/2024 Stage  prefix: Initial diagnosis Histologic grading system: 3 grade system     Thank you for this kind referral and the opportunity to participate in the care of this Patient   Visit Diagnosis 1. Malignant neoplasm of upper-outer quadrant of left breast in female, estrogen receptor positive (HCC)   2. Goals of care, counseling/discussion   3. Acute pain of left shoulder     Dr. Annah Melanee, MD, MPH [facility] [phone] 10/16/2024                    [1] No Known Allergies [2]  Current Outpatient Medications:    amLODipine  (NORVASC ) 5 MG tablet, Take 1 tablet (5 mg total) by mouth daily., Disp: 90 tablet, Rfl: 1   BIOTIN PO, Take by mouth., Disp: , Rfl:    cetirizine (ZYRTEC) 10 MG tablet, Take 10 mg by mouth daily., Disp: , Rfl:    Cholecalciferol-Vitamin C (VITAMIN D3-VITAMIN C PO), Take by mouth., Disp: , Rfl:    clonazePAM (KLONOPIN) 0.5 MG tablet, Take 0.5 mg by mouth 2 (two) times daily as needed for anxiety., Disp: , Rfl:    Multiple Vitamin (MULTI VITAMIN PO), Take by mouth., Disp: , Rfl:    pravastatin  (PRAVACHOL ) 40 MG tablet, TAKE 1 TABLET BY MOUTH EVERY DAY, Disp: 90 tablet, Rfl: 1   RYBELSUS 3 MG TABS, Take 3 mg by mouth., Disp: , Rfl:    Turmeric (QC TUMERIC COMPLEX PO), Take by mouth., Disp: , Rfl:    valsartan -hydrochlorothiazide  (DIOVAN -HCT) 320-25 MG tablet, TAKE 1 TABLET BY MOUTH EVERY DAY, Disp: 30 tablet, Rfl: 0   celecoxib  (CELEBREX ) 200 MG capsule, TAKE 1 CAPSULE BY MOUTH EVERY DAY (Patient not taking: Reported on 10/16/2024), Disp: 90 capsule, Rfl: 0   cyclobenzaprine (FLEXERIL) 10 MG tablet, Take 10 mg by mouth 2 (two) times daily as needed. (Patient not taking: Reported on 10/16/2024), Disp: , Rfl:    metoprolol  tartrate (LOPRESSOR ) 100 MG tablet, Take 1 tablet (100 mg total) by  mouth once for 1 dose. Please take one time dose 100mg  metoprolol  tartrate 2 hr prior to cardiac CT for HR control IF HR >55bpm. (Patient not  taking: Reported on 10/16/2024), Disp: 1 tablet, Rfl: 0   tiZANidine (ZANAFLEX) 4 MG tablet, Take 4 mg by mouth. (Patient not taking: Reported on 10/16/2024), Disp: , Rfl:    venlafaxine  XR (EFFEXOR -XR) 150 MG 24 hr capsule, TAKE 1 CAPSULE BY MOUTH DAILY WITH BREAKFAST. (Patient not taking: Reported on 10/16/2024), Disp: 90 capsule, Rfl: 1  "

## 2024-10-16 NOTE — Progress Notes (Signed)
 Accompanied patient and family to initial medical oncology appointment.   Reviewed Breast Cancer treatment handbook.   Care plan summary given to patient.   Reviewed outreach programs and cancer center services.

## 2024-10-20 ENCOUNTER — Other Ambulatory Visit: Payer: Self-pay | Admitting: General Surgery

## 2024-10-20 DIAGNOSIS — Z170 Estrogen receptor positive status [ER+]: Secondary | ICD-10-CM

## 2024-10-20 NOTE — Pre-Procedure Instructions (Signed)
 Surgical Instructions   Your procedure is scheduled on October 29, 2024. Report to [facility] Main Entrance A at 1:15 A.M., then check in with the Admitting office. Any questions or running late day of surgery: call [phone]  Questions prior to your surgery date: call [phone], Monday-Friday, 8am-4pm. If you experience any cold or flu symptoms such as cough, fever, chills, shortness of breath, etc. between now and your scheduled surgery, please notify us  at the above number.     Remember:  Do not eat after midnight the night before your surgery   You may drink clear liquids until 12:15 PM the afternoon of your surgery.   Clear liquids allowed are: Water, Non-Citrus Juices (without pulp), Carbonated Beverages, Clear Tea (no milk, honey, etc.), Black Coffee Only (NO MILK, CREAM OR POWDERED CREAMER of any kind), and Gatorade.    Take these medicines the morning of surgery with A SIP OF WATER: amLODipine  (NORVASC )  cetirizine (ZYRTEC)  venlafaxine  XR (EFFEXOR -XR)    May take these medicines IF NEEDED: clonazePAM (KLONOPIN)  tiZANidine (ZANAFLEX)    STOP taking your RYBELSUS 24 hours prior to surgery. Do not take any doses after January 28th at 12:00 pm.   One week prior to surgery, STOP taking any Aspirin (unless otherwise instructed by your surgeon) Aleve, Naproxen, Ibuprofen, Motrin, Advil, Goody's, BC's, all herbal medications, fish oil, and non-prescription vitamins. This includes your medication: celecoxib  (CELEBREX )                      Do NOT Smoke (Tobacco/Vaping) for 24 hours prior to your procedure.  If you use a CPAP at night, you may bring your mask/headgear for your overnight stay.   You will be asked to remove any contacts, glasses, piercing's, hearing aid's, dentures/partials prior to surgery. Please bring cases for these items if needed.    Your surgeon will determine if you are to be admitted or discharged the same day.  Patients discharged the day of  surgery will not be allowed to drive home, and someone needs to stay with them for 24 hours.  SURGICAL WAITING ROOM VISITATION Patients may have no more than 2 support people in the waiting area - these visitors may rotate.   Pre-op nurse will coordinate an appropriate time for 2 ADULT support persons, who may not rotate, to accompany patient in pre-op.  Children under the age of 56 must have an adult with them who is not the patient and must remain in the main waiting area with an adult.  If the patient needs to stay at the hospital during part of their recovery, the visitor guidelines for inpatient rooms apply.  Please refer to the [facility] website for the visitor guidelines for any additional information.   If you received a COVID test during your pre-op visit  it is requested that you wear a mask when out in public, stay away from anyone that may not be feeling well and notify your surgeon if you develop symptoms. If you have been in contact with anyone that has tested positive in the last 10 days please notify you surgeon.      Pre-operative CHG Bathing Instructions   You can play a key role in reducing the risk of infection after surgery. Your skin needs to be as free of germs as possible. You can reduce the number of germs on your skin by washing with CHG (chlorhexidine gluconate) soap before surgery. CHG is an antiseptic soap that kills  germs and continues to kill germs even after washing.   DO NOT use if you have an allergy to chlorhexidine/CHG or antibacterial soaps. If your skin becomes reddened or irritated, stop using the CHG and notify one of our RNs at [phone].              TAKE A SHOWER THE NIGHT BEFORE SURGERY   Please keep in mind the following:  DO NOT shave, including legs and underarms, 48 hours prior to surgery.   You may shave your face before/day of surgery.  Place clean sheets on your bed the night before surgery Use a clean washcloth (not used since  being washed) for shower. DO NOT sleep with pet's night before surgery.  CHG Shower Instructions:  Wash your face and private area with normal soap. If you choose to wash your hair, wash first with your normal shampoo.  After you use shampoo/soap, rinse your hair and body thoroughly to remove shampoo/soap residue.  Turn the water OFF and apply half the bottle of CHG soap to a CLEAN washcloth.  Apply CHG soap ONLY FROM YOUR NECK DOWN TO YOUR TOES (washing for 3-5 minutes)  DO NOT use CHG soap on face, private areas, open wounds, or sores.  Pay special attention to the area where your surgery is being performed.  If you are having back surgery, having someone wash your back for you may be helpful. Wait 2 minutes after CHG soap is applied, then you may rinse off the CHG soap.  Pat dry with a clean towel  Put on clean pajamas    Additional instructions for the day of surgery: If you choose, you may shower the morning of surgery with an antibacterial soap.  DO NOT APPLY any lotions, deodorants, cologne, or perfumes.   Do not wear jewelry or makeup Do not wear nail polish, gel polish, artificial nails, or any other type of covering on natural nails (fingers and toes) Do not bring valuables to the hospital. [facility] is not responsible for valuables/personal belongings. Put on clean/comfortable clothes.  Please brush your teeth.  Ask your nurse before applying any prescription medications to the skin.

## 2024-10-21 ENCOUNTER — Encounter: Payer: Self-pay | Admitting: *Deleted

## 2024-10-21 ENCOUNTER — Encounter: Payer: Self-pay | Admitting: Occupational Therapy

## 2024-10-21 ENCOUNTER — Encounter (HOSPITAL_COMMUNITY): Payer: Self-pay

## 2024-10-21 ENCOUNTER — Encounter (HOSPITAL_COMMUNITY)
Admission: RE | Admit: 2024-10-21 | Discharge: 2024-10-21 | Disposition: A | Discharge status: Home / Self Care | Payer: Self-pay | Source: Ambulatory Visit | Attending: General Surgery | Admitting: General Surgery

## 2024-10-21 ENCOUNTER — Ambulatory Visit: Payer: Self-pay | Attending: Oncology | Admitting: Occupational Therapy

## 2024-10-21 ENCOUNTER — Other Ambulatory Visit: Payer: Self-pay

## 2024-10-21 VITALS — BP 114/83 | HR 84 | Temp 97.7°F | Resp 17 | Ht 67.0 in | Wt 180.2 lb

## 2024-10-21 DIAGNOSIS — M25511 Pain in right shoulder: Secondary | ICD-10-CM | POA: Insufficient documentation

## 2024-10-21 DIAGNOSIS — Z01818 Encounter for other preprocedural examination: Secondary | ICD-10-CM

## 2024-10-21 DIAGNOSIS — I10 Essential (primary) hypertension: Secondary | ICD-10-CM | POA: Insufficient documentation

## 2024-10-21 DIAGNOSIS — Z01812 Encounter for preprocedural laboratory examination: Secondary | ICD-10-CM | POA: Insufficient documentation

## 2024-10-21 DIAGNOSIS — M25512 Pain in left shoulder: Secondary | ICD-10-CM | POA: Insufficient documentation

## 2024-10-21 DIAGNOSIS — C50912 Malignant neoplasm of unspecified site of left female breast: Secondary | ICD-10-CM | POA: Insufficient documentation

## 2024-10-21 LAB — CBC
HCT: 39.3 % (ref 36.0–46.0)
Hemoglobin: 12.8 g/dL (ref 12.0–15.0)
MCH: 28.2 pg (ref 26.0–34.0)
MCHC: 32.6 g/dL (ref 30.0–36.0)
MCV: 86.6 fL (ref 80.0–100.0)
Platelets: 272 K/uL (ref 150–400)
RBC: 4.54 MIL/uL (ref 3.87–5.11)
RDW: 12.6 % (ref 11.5–15.5)
WBC: 6 K/uL (ref 4.0–10.5)
nRBC: 0 % (ref 0.0–0.2)

## 2024-10-21 LAB — BASIC METABOLIC PANEL WITH GFR
Anion gap: 12 (ref 5–15)
BUN: 15 mg/dL (ref 6–20)
CO2: 28 mmol/L (ref 22–32)
Calcium: 10.1 mg/dL (ref 8.9–10.3)
Chloride: 102 mmol/L (ref 98–111)
Creatinine, Ser: 0.9 mg/dL (ref 0.44–1.00)
GFR, Estimated: >60 mL/min
Glucose, Bld: 125 mg/dL — ABNORMAL HIGH (ref 70–99)
Potassium: 3.9 mmol/L (ref 3.5–5.1)
Sodium: 141 mmol/L (ref 135–145)

## 2024-10-21 NOTE — Progress Notes (Signed)
 Lumpectomy is scheduled for 1/29.  She will see Dr. Melanee and Dr. Lenn on 2/18.  AVS given to her.

## 2024-10-21 NOTE — Therapy (Signed)
 " OUTPATIENT OCCUPATIONAL THERAPY BREAST CANCER BASELINE EVALUATION   Patient Name: Jessica Moore MRN: [id number] DOB:01-12-1966, 59 y.o., female Today's Date: 10/21/2024  END OF SESSION:  OT End of Session - 10/21/24 1748     Visit Number 1    Number of Visits 4    Date for Recertification  01/13/25    OT Start Time 0831    OT Stop Time 0910    OT Time Calculation (min) 39 min    Activity Tolerance Patient tolerated treatment well    Behavior During Therapy [facility] for tasks assessed/performed          Past Medical History:  Diagnosis Date   Breast cancer (HCC)    Hypertension    Past Surgical History:  Procedure Laterality Date   BREAST BIOPSY     BREAST BIOPSY Left 09/15/2024   US  LT BREAST BX W LOC DEV 1ST LESION IMG BX SPEC US  GUIDE 09/15/2024 [facility]   COLONOSCOPY WITH PROPOFOL  N/A 03/25/2018   Procedure: COLONOSCOPY WITH PROPOFOL ;  Surgeon: Therisa Bi, MD;  Location: [facility];  Service: Gastroenterology;  Laterality: N/A;   HAND TENDON SURGERY Right 1985   Pinky Finger   THYROIDECTOMY, PARTIAL Left 04/08/2019   Dr. Jerilynn   UTERINE FIBROID EMBOLIZATION     Patient Active Problem List   Diagnosis Date Noted   Malignant neoplasm of upper-outer quadrant of left breast in female, estrogen receptor positive (HCC) 10/16/2024   Perennial allergic rhinitis with seasonal variation 11/02/2021   Metabolic syndrome 11/02/2021   Chronic pain of left knee 11/02/2021   Dyslipidemia 11/02/2021   Dysfunctional grieving 11/02/2021   Thyroid  nodule 04/23/2019   Benign hypertension 06/30/2018   Goiter 06/30/2018   Uterine fibroid 06/30/2018   Perimenopausal symptom 06/30/2018    PCP: Dr Sherial  REFERRING PROVIDER: Dr Melanee  REFERRING DIAG: L breast Cancer  THERAPY DIAG:  Acute pain of both shoulders  Rationale for Evaluation and Treatment: Rehabilitation  ONSET DATE: 09/15/24  SUBJECTIVE:                                                                                                                                                                                            SUBJECTIVE STATEMENT: Patient reports she is here today after being refer by one of her medical team for her newly diagnosed left breast cancer.  My shoulder started bothering me after have been diagnosed.  The left upper arm is worse than the right.  I cannot sleep on my left side  PERTINENT HISTORY:  Patient was diagnosed with left  breast cancer - plan is to have L lumpectomy  by Dr Aron.   PATIENT GOALS:   reduce lymphedema risk and learn post op HEP.   PAIN:  Are you having pain?  Patient report increased soreness in bilateral upper arms with the left worse than right since after diagnosis  PRECAUTIONS: Active CA , left lymphedema risk     HAND DOMINANCE: right  WEIGHT BEARING RESTRICTIONS: No  FALLS:  Has patient fallen in last 6 months? No  LIVING ENVIRONMENT: Patient lives with: Husband  OCCUPATION and LEISURE: Homemaker; spent time with family and grandchildren   OBJECTIVE:  COGNITION: Overall cognitive status: Within functional limits for tasks assessed    POSTURE:  rounded shoulders posture  UPPER EXTREMITY AROM/PROM:  Bilateral shoulder active range of motion within normal range.  But 5-6/10 pain on the left upper arm with shoulder abduction more than flexion; external and internal rotation within normal limits.'s with some discomfort and pain on upper arm with external rotation CERVICAL AROM: All within normal limits:     UPPER EXTREMITY STRENGTH: 5 -/5.  With internal/external rotation.  5/5 for shoulder abduction and flexion with some discomfort with shoulder abduction left worse than the right  LYMPHEDEMA ASSESSMENTS:    L-DEX LYMPHEDEMA SCREENING:  The patient was assessed using the L-Dex machine today to produce a lymphedema index baseline score. The patient will be reassessed on a regular basis (typically every  3 months) to obtain new L-Dex scores. If the score is > 6.5 points away from his/her baseline score indicating onset of subclinical lymphedema, it will be recommended to wear a compression garment for 4 weeks, 12 hours per day and then be reassessed. If the score continues to be > 6.5 points from baseline at reassessment, we will initiate lymphedema treatment. Assessing in this manner has a 95% rate of preventing clinically significant lymphedema.   L-DEX FLOWSHEETS - 10/21/24 1700       L-DEX LYMPHEDEMA SCREENING   Measurement Type Unilateral    L-DEX MEASUREMENT EXTREMITY Upper Extremity    POSITION  Standing    DOMINANT SIDE Right    At Risk Side Left    BASELINE SCORE (UNILATERAL) -4.6          L-Dex score done for baseline see above Upon assessment patient pain in bilateral shoulder mostly upper arms increased after diagnosis in December and when she went on a cruise. Upon assessment patient with increased tightness and trigger points in upper traps Tender to touch Recommend for patient to get the upper back massage in the next week Provided patient with some cervical lateral flexion stretches and scalene stretches 2-3 times a day 10 reps Two 3 x 2 5 times a day scapular retraction Patient educated to perform after surgery active assisted range of motion in supine using a wand for shoulder flexion and abduction 10 reps 3 times a day up to 90 degrees-after 7 to 10 days can increase overhead past 90 degrees with a slight pull keeping pain less than a 2/10 In supine performing external rotation with gravity 3 times a day 10-12 reps pain-free Patient was also educated in signs and symptoms as well as preventions and precautions for lymphedema.  Handout provided and reviewed. Patient reports feeling better at the end of session.  PATIENT EDUCATION:  Education details: Lymphedema risk reduction and post op shoulder/posture HEP Person educated: Patient Education method: Explanation,  Demonstration, Handout Education comprehension: Patient verbalized understanding and returned demonstration    ASSESSMENT:  CLINICAL IMPRESSION: Her multidisciplinary medical team has met to assess  and determine a recommended treatment plan. She is planning to have left lumpectomy by Dr. Aron on 10/29/2024.  Patient present today with increased discomfort and pain in bilateral upper arms with the left worse than the right since diagnosis in December.  Upon assessment patient with increased tightness and trigger points in bilateral upper traps.  Patient was recommended to get a upper back massage followed by home exercises reviewed with patient for cervical lateral flexion and scalene stretches as well as scapular retraction.  Patient report feeling better at the end of session.  Patient was reviewed home exercises to be performed after lumpectomy as well as educated and reviewed with her signs and symptoms of lymphedema as well as prevention precautions.  Handout provided and reviewed.  Will further be educated and reviewed and home exercises upgraded after surgery.  She will benefit from a post op OT reassessment to determine needs and from L-Dex screens every 3 months for 2 years to detect subclinical lymphedema.  Pt will benefit from skilled therapeutic intervention to improve on the following deficits: Decreased knowledge of precautions and lymphedema education, impaired UE functional use, pain, decreased ROM, postural dysfunction.   OT treatment/interventions: ADL/self-care home management, pt/family education, therapeutic exercise,manual therapy  REHAB POTENTIAL: Good  CLINICAL DECISION MAKING: Stable/uncomplicated  EVALUATION COMPLEXITY: Low   GOALS: Goals reviewed with patient? YES  LONG TERM GOALS: (STG=LTG)    Name Target Date Goal status  1 Pt will be able to verbalize understanding of pertinent lymphedema risk reduction practices relevant to her dx specifically related to skin  care.  Baseline:  No knowledge 12 weeks Initial  2 Pt will be able to return demo and/or verbalize understanding of the post op HEP related to regaining shoulder ROM. Baseline:  No knowledge Today Achieved at eval        4 Pt will demo she has regained full shoulder ROM and function post operatively compared to baselines.  Baseline: See objective measurements taken today. 12 weeks Initial    PLAN:  OT FREQUENCY/DURATION: EVAL and 3 follow up appointment.  12 weeks  PLAN FOR NEXT SESSION: will reassess 2-3 weeks post op to determine needs.  Occupational Therapy Information for After Breast Cancer Surgery/Treatment:  Lymphedema is a swelling condition that you may be at risk for in your arm if you have lymph nodes removed from the armpit area.  After a sentinel node biopsy, the risk is approximately 5-9% and is higher after an axillary node dissection.  There is treatment available for this condition and it is not life-threatening.  Contact your physician or occupational therapist with concerns. You may begin the 4 shoulder/posture exercises (see additional sheet) when permitted by your physician (typically a week after surgery).  If you have drains, you may need to wait until those are removed before beginning range of motion exercises.  A general recommendation is to not lift your arms above shoulder height until drains are removed.  These exercises should be done to your tolerance and gently.  This is not a no pain/no gain type of recovery so listen to your body and stretch into the range of motion that you can tolerate, stopping if you have pain.  If you are having immediate reconstruction, ask your plastic surgeon about doing exercises as he or she may want you to wait. .  While undergoing any medical procedure or treatment, try to avoid blood pressure being taken or needle sticks from occurring on the arm on the side of cancer.  This recommendation begins after surgery and continues for  the rest of your life.  This may help reduce your risk of getting lymphedema (swelling in your arm). An excellent resource for those seeking information on lymphedema is the National Lymphedema Network's web site. It can be accessed at www.lymphnet.org If you notice swelling in your hand, arm or breast at any time following surgery (even if it is many years from now), please contact your doctor or occupational therapist to discuss this.  Lymphedema can be treated at any time but it is easier for you if it is treated early on.  If you feel like your shoulder motion is not returning to normal in a reasonable amount of time, please contact your surgeon or occupational therapist.  [facility] [phone]. [address], [place], [place] [address]       Ancel Peters, OTR;CLT 10/21/2024, 5:50 PM   "

## 2024-10-21 NOTE — Progress Notes (Signed)
 PCP - Lavenia Beaver, MD Cardiologist - Cara Lovelace  PPM/ICD - Denies Device Orders - n/a Rep Notified - n/a  Chest x-ray - n/a EKG - 10-21-24 Stress Test - 08-08-22 ECHO - 08-08-22 Cardiac Cath - denies  Sleep Study - denies CPAP - n/a  NON-diabetic  Last dose of GLP1 agonist-  denies GLP1 instructions: n/a  Blood Thinner Instructions: denies Aspirin Instructions: denies  ERAS Protcol - clears until 1215 PRE-SURGERY Ensure or G2- none  COVID TEST- n/a   Anesthesia review: Yes, breast seed placement at [facility] on 10/27/24 @ 11:30  Patient denies shortness of breath, fever, cough and chest pain at PAT appointment   All instructions explained to the patient, with a verbal understanding of the material. Patient agrees to go over the instructions while at home for a better understanding. Patient also instructed to self quarantine after being tested for COVID-19. The opportunity to ask questions was provided.

## 2024-10-21 NOTE — Progress Notes (Signed)
 Surgical Instructions     Your procedure is scheduled on October 29, 2024. Report to [facility] Main Entrance A at 1:15 A.M., then check in with the Admitting office. Any questions or running late day of surgery: call [phone]   Questions prior to your surgery date: call [phone], Monday-Friday, 8am-4pm. If you experience any cold or flu symptoms such as cough, fever, chills, shortness of breath, etc. between now and your scheduled surgery, please notify us  at the above number.            Remember:       Do not eat after midnight the night before your surgery     You may drink clear liquids until 12:15 PM the afternoon of your surgery.   Clear liquids allowed are: Water, Non-Citrus Juices (without pulp), Carbonated Beverages, Clear Tea (no milk, honey, etc.), Black Coffee Only (NO MILK, CREAM OR POWDERED CREAMER of any kind), and Gatorade.          Take these medicines the morning of surgery with A SIP OF WATER: amLODipine  (NORVASC )  cetirizine (ZYRTEC)      May take these medicines IF NEEDED:   tiZANidine (ZANAFLEX)      STOP taking your RYBELSUS 24 hours prior to surgery. Do not take any doses after January 28th at 12:00 pm.     One week prior to surgery, STOP taking any Aspirin (unless otherwise instructed by your surgeon) Aleve, Naproxen, Ibuprofen, Motrin, Advil, Goody's, BC's, all herbal medications, fish oil, and non-prescription vitamins. This includes your medication: celecoxib  (CELEBREX )                      Do NOT Smoke (Tobacco/Vaping) for 24 hours prior to your procedure.   If you use a CPAP at night, you may bring your mask/headgear for your overnight stay.   You will be asked to remove any contacts, glasses, piercing's, hearing aid's, dentures/partials prior to surgery. Please bring cases for these items if needed.    Your surgeon will determine if you are to be admitted or discharged the same day.  Patients discharged the day of surgery will not be  allowed to drive home, and someone needs to stay with them for 24 hours.   SURGICAL WAITING ROOM VISITATION Patients may have no more than 2 support people in the waiting area - these visitors may rotate.   Pre-op nurse will coordinate an appropriate time for 2 ADULT support persons, who may not rotate, to accompany patient in pre-op.  Children under the age of 72 must have an adult with them who is not the patient and must remain in the main waiting area with an adult.   If the patient needs to stay at the hospital during part of their recovery, the visitor guidelines for inpatient rooms apply.   Please refer to the [facility] website for the visitor guidelines for any additional information.     If you received a COVID test during your pre-op visit  it is requested that you wear a mask when out in public, stay away from anyone that may not be feeling well and notify your surgeon if you develop symptoms. If you have been in contact with anyone that has tested positive in the last 10 days please notify you surgeon.         Pre-operative CHG Bathing Instructions    You can play a key role in reducing the risk of infection after surgery. Your skin needs  to be as free of germs as possible. You can reduce the number of germs on your skin by washing with CHG (chlorhexidine gluconate) soap before surgery. CHG is an antiseptic soap that kills germs and continues to kill germs even after washing.    DO NOT use if you have an allergy to chlorhexidine/CHG or antibacterial soaps. If your skin becomes reddened or irritated, stop using the CHG and notify one of our RNs at [phone].               TAKE A SHOWER THE NIGHT BEFORE SURGERY    Please keep in mind the following:  DO NOT shave, including legs and underarms, 48 hours prior to surgery.   You may shave your face before/day of surgery.  Place clean sheets on your bed the night before surgery Use a clean washcloth (not used since being  washed) for shower. DO NOT sleep with pet's night before surgery.   CHG Shower Instructions:  Wash your face and private area with normal soap. If you choose to wash your hair, wash first with your normal shampoo.  After you use shampoo/soap, rinse your hair and body thoroughly to remove shampoo/soap residue.  Turn the water OFF and apply half the bottle of CHG soap to a CLEAN washcloth.  Apply CHG soap ONLY FROM YOUR NECK DOWN TO YOUR TOES (washing for 3-5 minutes)  DO NOT use CHG soap on face, private areas, open wounds, or sores.  Pay special attention to the area where your surgery is being performed.  If you are having back surgery, having someone wash your back for you may be helpful. Wait 2 minutes after CHG soap is applied, then you may rinse off the CHG soap.  Pat dry with a clean towel  Put on clean pajamas     Additional instructions for the day of surgery: If you choose, you may shower the morning of surgery with an antibacterial soap.  DO NOT APPLY any lotions, deodorants, cologne, or perfumes.   Do not wear jewelry or makeup Do not wear nail polish, gel polish, artificial nails, or any other type of covering on natural nails (fingers and toes) Do not bring valuables to the hospital. [facility] is not responsible for valuables/personal belongings. Put on clean/comfortable clothes.  Please brush your teeth.  Ask your nurse before applying any prescription medications to the skin.

## 2024-10-22 NOTE — Progress Notes (Signed)
 Anesthesia Chart Review:  Case: [id number] Date/Time: 10/29/24 1458   Procedure: BREAST LUMPECTOMY WITH RADIOACTIVE SEED AND SENTINEL LYMPH NODE BIOPSY (Left: Breast) - GEN w/PEC BLOCK LEFT BREAST RADIOACTIVE SEED LOCALIZED LUMPECTOMY AND SENTINEL LYMPH NODE BIOPSY   Anesthesia type: General   Pre-op diagnosis: LEFT BREAST CANCER   [facility] ROOM 02 / [facility]   Surgeons: Aron Shoulders, MD       DISCUSSION: Patient is a 59 year old female scheduled for the above procedure.  History includes never smoker, HTN, breast cancer (left invasive mammary carcinoma & in situ 09/15/2024), uterine fibroid embolization, multinodular goiter (s/p left thyroid  lobectomy 04/08/2019).   She had cardiology evaluation at [facility] with Dr. Florencio in November 2023 for SOB, fatigue and palpitations. EKG showed SR, lateral ST/T wave abnormality. Reassuring work-up. 08/2022 TTE showed normal LV/RV systolic function, trivial AR/MR/PR, mild TR; Holter monitor showed HR 65-173 with average 90 bpm, rare PACs, no significant PVC, no afib/high grade block/pauses; CCTA showed CAC of 0 with no evidence of CAD.   Anesthesia team to evaluate on the day of surgery. RSL 10/27/2024 at 11:30 AM.   VS: BP 114/83   Pulse 84   Temp 36.5 C   Resp 17   Ht 5' 7 (1.702 m)   Wt 81.7 kg   LMP 04/04/2019   SpO2 100%   BMI 28.22 kg/m   PROVIDERS: Sherial Bail, MD is PCP  Florencio Kava, MD is cardiologist Melanee Piggs, MD is HEM-ONC  LABS: Labs reviewed: Acceptable for surgery. (all labs ordered are listed, but only abnormal results are displayed)  Labs Reviewed  BASIC METABOLIC PANEL WITH GFR - Abnormal; Notable for the following components:      Result Value   Glucose, Bld 125 (*)    All other components within normal limits  CBC     EKG: EKG 10/21/2024: Normal sinus rhythm  ST & T wave abnormality, consider lateral ischemia  Abnormal ECG Confirmed by Barbaraann Kotyk [phone]) on 10/22/2024  5:10:27 PM - By EKG [facility] narrative from 07/12/2022, EKG showed NSR, ST/T wave abnormality in lateral leads    CV: CTA Coronary 08/30/2022: IMPRESSION: 1. Normal coronary calcium score of 0.  Patient is low risk.  2. Normal coronary origin with right dominance.  3. No evidence of CAD.  4. CAD-RADS 0. Consider non-atherosclerotic causes of dyspnea or chest pain.  Holter Monitor 72 hours 08/13/2022 - 08/16/2022 ([facility] CE): Total beats 329,306 Minimum rate 65 maximum 173 average 90 Mostly sinus rhythm No significant PVCs Rare PACs No significant sustained runs No pauses No high-grade blocks No ST segment changes No evidence of atrial fibrillation No diary submitted  Conclusion Relatively benign Holter  recommend conservative management consider low-dose beta-blockers and/or Cardizem for symptom management as necessary  Echo 08/08/2022 ([facility] CE): INTERPRETATION  NORMAL LEFT VENTRICULAR SYSTOLIC FUNCTION  NORMAL RIGHT VENTRICULAR SYSTOLIC FUNCTION  MILD VALVULAR REGURGITATION (Trivial AR, Trivial MR, Mild TR, Trivial PR)  NO VALVULAR STENOSIS    Past Medical History:  Diagnosis Date   Breast cancer (HCC)    Hypertension     Past Surgical History:  Procedure Laterality Date   BREAST BIOPSY     BREAST BIOPSY Left 09/15/2024   US  LT BREAST BX W LOC DEV 1ST LESION IMG BX SPEC US  GUIDE 09/15/2024 [facility]   COLONOSCOPY WITH PROPOFOL  N/A 03/25/2018   Procedure: COLONOSCOPY WITH PROPOFOL ;  Surgeon: Therisa Bi, MD;  Location: [facility];  Service: Gastroenterology;  Laterality: N/A;  HAND TENDON SURGERY Right 1985   Pinky Finger   THYROIDECTOMY, PARTIAL Left 04/08/2019   Dr. Jerilynn   UTERINE FIBROID EMBOLIZATION      MEDICATIONS:  amLODipine  (NORVASC ) 2.5 MG tablet   amLODipine  (NORVASC ) 5 MG tablet   ascorbic acid (VITAMIN C) 500 MG tablet   BIOTIN PO   celecoxib  (CELEBREX ) 200 MG capsule   cetirizine (ZYRTEC) 10 MG tablet   cholecalciferol (VITAMIN D3)  25 MCG (1000 UNIT) tablet   clonazePAM (KLONOPIN) 0.5 MG tablet   metoprolol  tartrate (LOPRESSOR ) 100 MG tablet   Multiple Vitamin (MULTI VITAMIN PO)   pravastatin  (PRAVACHOL ) 20 MG tablet   pravastatin  (PRAVACHOL ) 40 MG tablet   RYBELSUS 3 MG TABS   tiZANidine (ZANAFLEX) 4 MG tablet   Turmeric (QC TUMERIC COMPLEX PO)   valsartan -hydrochlorothiazide  (DIOVAN -HCT) 320-25 MG tablet   venlafaxine  XR (EFFEXOR -XR) 150 MG 24 hr capsule   vitamin E 180 MG (400 UNITS) capsule   No current facility-administered medications for this encounter.   Advised at PAT to hold Rybelsus for 24 hours before surgery.   Isaiah Ruder, PA-C Surgical Short Stay/Anesthesiology [facility] Phone [phone] [facility] Phone [phone] 10/22/2024 11:29 PM

## 2024-10-22 NOTE — Anesthesia Preprocedure Evaluation (Addendum)
"                                    Anesthesia Evaluation  Patient identified by MRN, date of birth, ID band Patient awake    Reviewed: Allergy & Precautions, NPO status , Patient's Chart, lab work & pertinent test results  Airway Mallampati: II  TM Distance: >3 FB Neck ROM: Full    Dental  (+) Teeth Intact, Dental Advisory Given   Pulmonary neg pulmonary ROS   Pulmonary exam normal breath sounds clear to auscultation       Cardiovascular hypertension, Pt. on medications Normal cardiovascular exam Rhythm:Regular Rate:Normal  CTA Coronary 08/30/2022: IMPRESSION: 1. Normal coronary calcium score of 0.  Patient is low risk. 2. Normal coronary origin with right dominance. 3. No evidence of CAD. 4. CAD-RADS 0. Consider non-atherosclerotic causes of dyspnea or chest pain.  Echo 08/08/2022 ([facility]): INTERPRETATION  NORMAL LEFT VENTRICULAR SYSTOLIC FUNCTION  NORMAL RIGHT VENTRICULAR SYSTOLIC FUNCTION  MILD VALVULAR REGURGITATION (Trivial AR, Trivial MR, Mild TR, Trivial PR)  NO VALVULAR STENOSIS     Neuro/Psych negative neurological ROS  negative psych ROS   GI/Hepatic negative GI ROS, Neg liver ROS,,,  Endo/Other  negative endocrine ROS    Renal/GU negative Renal ROS     Musculoskeletal negative musculoskeletal ROS (+)    Abdominal   Peds  Hematology negative hematology ROS (+)   Anesthesia Other Findings Day of surgery medications reviewed with the patient.  LEFT BREAST CANCER  Reproductive/Obstetrics                              Anesthesia Physical Anesthesia Plan  ASA: 2  Anesthesia Plan: General   Post-op Pain Management: Regional block*, Tylenol  PO (pre-op)* and Toradol  IV (intra-op)*   Induction: Intravenous  PONV Risk Score and Plan: 3 and Midazolam , Dexamethasone  and Ondansetron   Airway Management Planned: LMA  Additional Equipment:   Intra-op Plan:   Post-operative Plan: Extubation in  OR  Informed Consent: I have reviewed the patients History and Physical, chart, labs and discussed the procedure including the risks, benefits and alternatives for the proposed anesthesia with the patient or authorized representative who has indicated his/her understanding and acceptance.     Dental advisory given  Plan Discussed with: CRNA  Anesthesia Plan Comments: (PAT note written 10/22/2024 by Allison Zelenak, PA-C.  )         Anesthesia Quick Evaluation  "

## 2024-10-23 ENCOUNTER — Ambulatory Visit: Payer: Self-pay | Admitting: Oncology

## 2024-10-27 ENCOUNTER — Other Ambulatory Visit: Payer: Self-pay | Admitting: General Surgery

## 2024-10-27 ENCOUNTER — Ambulatory Visit
Admission: RE | Admit: 2024-10-27 | Discharge: 2024-10-27 | Disposition: A | Payer: Self-pay | Source: Ambulatory Visit | Attending: General Surgery | Admitting: General Surgery

## 2024-10-27 DIAGNOSIS — C50212 Malignant neoplasm of upper-inner quadrant of left female breast: Secondary | ICD-10-CM

## 2024-10-27 NOTE — H&P (Signed)
 "  REFERRING PHYSICIAN: Anice  PROVIDER: JINA CLAIR NEPHEW, MD  Care Team: Patient Care Team: Sherial Bail, MD as PCP - General (Internal Medicine) Nephew Jina Clair, MD as Consulting Provider (Surgical Oncology) Melanee Annah BROCKS, MD as Referring Physician (Hematology and Oncology) Florencio Cara Endow, MD as Consulting Provider (Cardiovascular Disease)   MRN: [id number] DOB: 12-17-1965 DATE OF ENCOUNTER: 10/20/2024  Subjective   Chief Complaint: Left Breast Cancer   History of Present Illness: Jessica Moore is a 59 y.o. female who is seen today as an office consultation at the request of Dr. Sherial for evaluation of Left Breast Cancer  History of Present Illness Jessica Moore is a 59 year old female with newly diagnosed estrogen receptor positive left breast cancer who presents 10/2024 for surgical consultation regarding lumpectomy and sentinel lymph node biopsy.  Patient was getting a diagnostic mammogram for 1 year follow-up of a probably benign mass on the left breast. This area did still appear benign, but she was found to have a 1 cm mass at 10:00 5 cm from the nipple on the left. Core needle biopsy showed a grade 2 invasive mammary carcinoma that was ductal phenotype. 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 This was ER and PR positive, HER2 negative, Ki-67 of 15%.  She has a non-palpable malignancy in the upper-inner quadrant of the left breast, diagnosed by biopsy in [place]. She reports localized soreness at the biopsy site, which has recently increased, and attributes this to the procedure and repeated examinations. She has no history of prior breast surgeries or radiation.  A clip was placed at the biopsy site. She has discussed Oncotype testing with her oncologist and has been referred for genetic evaluation.  She denies any personal history of breast cancer or other malignancies. Family history is notable for a brother with prostate cancer, but  there is no family history of breast or gynecologic cancers. Menstrual history is significant for menopause since approximately 2017, with ongoing hot flashes and no vaginal bleeding since that time.  She has previously been evaluated by cardiology and underwent echocardiography, with no reported cardiac issues or contraindications to surgery.  Family cancer history - none  Work - arts development officer  Diagnostic mammogram/us  :09/08/24 ACR Breast Density Category c: The breasts are heterogeneously dense, which may obscure small masses. FINDINGS: There are multiple round and oval masses which have waxed and waned since prior exam. Given fluctuation over time and history of cysts, these are most consistent with benign cysts. The LEFT breast focal asymmetry versus circumscribed mass in the lower-inner quadrant of the LEFT breast at the posterior depth is unchanged on this 1 year follow-up. No sonographic correlate was identified on prior workup. New 5 mm mass in the MEDIAL LEFT breast which is slightly irregular in shape. Ultrasound was performed. At the LEFT breast 10 o'clock position 5 cm from the nipple there is an irregular hypoechoic mass with angular margins measuring 10 x 5 x 4 mm. This correlates with the mammographic finding. IMPRESSION: 1. Indeterminate 10 mm LEFT breast mass at the 10 o'clock position for which ultrasound-guided biopsy is recommended. 2. No significant interval change in appearance of probably benign LEFT breast mass/focal asymmetry without sonographic correlate on this 12 month follow-up. A final follow-up of this probably benign finding in 1 year is recommended to confirm 2 years of stability. 3. No mammographic evidence of malignancy in the RIGHT breast. BILATERAL round and oval circumscribed fluctuating masses, similar to prior exams, a benign  finding. RECOMMENDATION: Ultrasound-guided biopsy of the LEFT breast x1. BI-RADS CATEGORY 4: Suspicious.  Pathology  core needle biopsy: 09/15/24 1. Breast, left, needle core biopsy, 10 o'clock, 5cmfn, 10mm, heart clip :  INVASIVE MAMMARY CARCINOMA  MAMMARY CARCINOMA IN SITU, SOLID TYPE, INTERMEDIATE TO HIGH NUCLEAR GRADE  TUBULE FORMATION: SCORE 3  NUCLEAR PLEOMORPHISM: SCORE 3  MITOTIC COUNT: SCORE 1  TOTAL SCORE: 7  OVERALL GRADE: 2  LYMPHOVASCULAR INVASION: NOT IDENTIFIED  CANCER LENGTH: 8 MM / 0.8 CM  CALCIFICATIONS: NOT IDENTIFIED  OTHER FINDINGS: NONE  SEE NOTE   Receptors: The tumor cells are negative for Her2 (1+).  Estrogen Receptor: 95%, positive, moderate to strong intensity  Progesterone Receptor: 2%, positive, strong staining intensity  Proliferation Marker Ki67: 15%   Review of Systems: A complete review of systems was obtained from the patient. I have reviewed this information and discussed as appropriate with the patient. See HPI as well for other ROS.  ROS - otherwise negative.   Medical History: Past Medical History:  Diagnosis Date  Depression  Hypertension   Patient Active Problem List  Diagnosis  Benign hypertension  Goiter  Perimenopausal symptom  Uterine fibroid  Chronic pain of left knee  Dysfunctional grieving  Dyslipidemia  Metabolic syndrome  Perennial allergic rhinitis with seasonal variation  Malignant neoplasm of upper-inner quadrant of left breast in female, estrogen receptor positive (CMS/HHS-HCC)   Past Surgical History:  Procedure Laterality Date  COLONOSCOPY 03/2018  OTHER SURGERY  pinky finger repair  THYROIDECTOMY TOTAL  uterine ablation    No Known Allergies  Current Outpatient Medications on File Prior to Visit  Medication Sig Dispense Refill  amLODIPine  (NORVASC ) 2.5 MG tablet TAKE 1 TABLET BY MOUTH EVERY DAY 30 tablet 8  ascorbic acid, vitamin C, (VITAMIN C) 1000 MG tablet Take 1,000 mg by mouth once daily  cholecalciferol (VITAMIN D3) 1000 unit capsule Take 1,000 Units by mouth once daily  multivitamin tablet Take 1 tablet by  mouth once daily  naproxen (NAPROSYN) 500 MG tablet TAKE 1 TABLET BY MOUTH 2 TIMES DAILY AS NEEDED (LOW BACK PAIN) FOR UP TO 30 DAYS 30 tablet 0  pravastatin  (PRAVACHOL ) 20 MG tablet TAKE 1 TABLET BY MOUTH EVERYDAY AT BEDTIME 30 tablet 8  pravastatin  (PRAVACHOL ) 40 MG tablet TAKE 1 TABLET BY MOUTH EVERYDAY AT BEDTIME 30 tablet 11  RED BEET ORAL Take by mouth  RYBELSUS 3 mg tablet TAKE 1 TABLET (3 MG TOTAL) BY MOUTH ONCE DAILY 30 tablet 5  tiZANidine (ZANAFLEX) 4 MG tablet Take 1 tablet (4 mg total) by mouth 2 (two) times daily as needed for Muscle spasms 20 tablet 0  TURMERIC ORAL Take by mouth  valsartan -hydroCHLOROthiazide  (DIOVAN -HCT) 320-25 mg tablet TAKE 1 TABLET BY MOUTH EVERY DAY 30 tablet 8  venlafaxine  (EFFEXOR -XR) 150 MG XR capsule Take 1 capsule (150 mg total) by mouth at bedtime 90 capsule 3   No current facility-administered medications on file prior to visit.   Family History  Problem Relation Age of Onset  Diabetes Mother  High blood pressure (Hypertension) Maternal Grandmother    Social History   Tobacco Use  Smoking Status Never  Smokeless Tobacco Never    Social History   Socioeconomic History  Marital status: Married  Tobacco Use  Smoking status: Never  Smokeless tobacco: Never  Vaping Use  Vaping status: Never Used  Substance and Sexual Activity  Alcohol use: Not Currently  Alcohol/week: 0.0 standard drinks of alcohol  Comment: occassional  Drug use: Never  Sexual  activity: Defer   Social Drivers of Corporate Investment Banker Strain: Low Risk (05/29/2024)  Overall Financial Resource Strain (CARDIA)  Difficulty of Paying Living Expenses: Not hard at all  Food Insecurity: No Food Insecurity (10/16/2024)  [facility]  Hunger Vital Sign  Within the past 12 months, you worried that your food would run out before you got the money to buy more.: Never true  Within the past 12 months, the food you bought just didn't last and you didn't have  money to get more.: Never true  Transportation Needs: No Transportation Needs (05/29/2024)  PRAPARE - Risk Analyst (Medical): No  Lack of Transportation (Non-Medical): No  Physical Activity: Sufficiently Active (09/18/2019)  Received from [facility]  Exercise Vital Sign  On average, how many days per week do you engage in moderate to strenuous exercise (like a brisk walk)?: 5 days  On average, how many minutes do you engage in exercise at this level?: 60 min  Stress: No Stress Concern Present (09/18/2019)  Received from [facility] Occupational [facility] - Occupational Stress Questionnaire  Feeling of Stress : Not at all  Social Connections: Socially Integrated (09/18/2019)  Received from [facility]  Social Connection and Isolation Panel  In a typical week, how many times do you talk on the phone with family, friends, or neighbors?: More than three times a week  How often do you get together with friends or relatives?: More than three times a week  How often do you attend church or religious services?: More than 4 times per year  Do you belong to any clubs or organizations such as church groups, unions, fraternal or athletic groups, or school groups?: Yes  How often do you attend meetings of the clubs or organizations you belong to?: More than 4 times per year  Are you married, widowed, divorced, separated, never married, or living with a partner?: Married  Housing Stability: Low Risk (05/29/2024)  Housing Stability Vital Sign  Unable to Pay for Housing in the Last Year: No  Number of Times Moved in the Last Year: 0  Homeless in the Last Year: No   Objective:   Vitals:  10/20/24 0953  BP: 128/82  Pulse: 94  Temp: 36.9 C (98.4 F)  SpO2: 97%  Weight: 81.4 kg (179 lb 6.4 oz)  Height: 170.2 cm (5' 7)  PainSc: 2   Body mass index is 28.1 kg/m.  Gen: No acute distress. Well nourished and well groomed.  Neurological: Alert and  oriented to person, place, and time. Coordination normal.  Head: Normocephalic and atraumatic.  Eyes: Conjunctivae are normal. Pupils are equal, round, and reactive to light. No scleral icterus.  Neck: Normal range of motion. Neck supple. No tracheal deviation or thyromegaly present.  Cardiovascular: Normal rate, regular rhythm, normal heart sounds and intact distal pulses. Exam reveals no gallop and no friction rub. No murmur heard. Breast: Breasts are relatively symmetric. No palpable masses. No contour abnormalities. No skin dimpling. No nipple retraction or nipple discharge. No lymphadenopathy. No appreciated bruising on the left breast Respiratory: Effort normal. No respiratory distress. No chest wall tenderness. Breath sounds normal. No wheezes, rales or rhonchi.  GI: Soft. Bowel sounds are normal. The abdomen is soft and nontender. There is no rebound and no guarding.  Musculoskeletal: Normal range of motion. Extremities are nontender.  Lymphadenopathy: No cervical, preauricular, postauricular or axillary adenopathy is present Skin: Skin is warm and dry. No rash  noted. No diaphoresis. No erythema. No pallor. No clubbing, cyanosis, or edema.  Psychiatric: Normal mood and affect. Behavior is normal. Judgment and thought content normal.   Labs None new  Assessment and Plan:   ICD-10-CM  1. Malignant neoplasm of upper-inner quadrant of left breast in female, estrogen receptor positive (CMS/HHS-HCC) C50.212 Ambulatory Referral to Physical Therapy  Z17.0    Assessment & Plan Estrogen receptor positive malignant neoplasm of upper-inner quadrant of left breast Stage I, estrogen receptor positive breast carcinoma in the upper-inner quadrant of the left breast. Non-palpable tumor with normal lymph node imaging. Favorable prognosis. Lumpectomy with adjuvant radiation preferred for survival and cosmetic outcomes. Mastectomy offers no additional survival benefit. Final pathology to determine  margin status and lymph node involvement. Oncotype DX to guide adjuvant therapy. Adjuvant antihormonal treatment is also recommended following surgery, radiation, and possible chemo. -Patient will be referred to radiation oncology postop. - Recommended lumpectomy with seed localization and sentinel lymph node biopsy. - Discussed mastectomy as an alternative and explained rationale for lumpectomy. - Reviewed expected breast appearance and potential asymmetry. - Referred for genetic testing already by med onc. - Explained need for preoperative seed localization by radiology. - Described outpatient surgical and anesthesia process, including pectoral block for analgesia. - Described sentinel lymph node biopsy using magnetic/iron-based dye. - Referred to physical therapy for preoperative arm measurement and mobility assessment. - Referred to [facility] for post-surgical bra fitting. - Advised to avoid heavy lifting or strenuous activity for 1-2 weeks postoperatively. - Advised to avoid swimming for 2 weeks; showers permitted starting postoperative day 2. - Reviewed wound care: dissolvable sutures, tissue adhesive, steri-strips; minimal dressing changes required. - Discussed surgical risks and anticipated outcomes. - Prescribe small number of narcotic analgesic tablets for breakthrough pain; encouraged acetaminophen , NSAIDs, and ice as first-line. - Explained that final pathology and Oncotype DX will guide further therapy; port placement may be coordinated if chemotherapy is indicated.  "

## 2024-10-28 ENCOUNTER — Inpatient Hospital Stay: Payer: Self-pay | Admitting: Licensed Clinical Social Worker

## 2024-10-28 ENCOUNTER — Encounter: Payer: Self-pay | Admitting: Licensed Clinical Social Worker

## 2024-10-28 DIAGNOSIS — Z1379 Encounter for other screening for genetic and chromosomal anomalies: Secondary | ICD-10-CM | POA: Insufficient documentation

## 2024-10-28 DIAGNOSIS — C50412 Malignant neoplasm of upper-outer quadrant of left female breast: Secondary | ICD-10-CM

## 2024-10-28 DIAGNOSIS — Z8042 Family history of malignant neoplasm of prostate: Secondary | ICD-10-CM

## 2024-10-28 NOTE — Progress Notes (Signed)
 REFERRING PROVIDER: Melanee Annah BROCKS, MD [address] [place],  [place] [address]  PRIMARY PROVIDER:  Sherial Bail, MD  PRIMARY REASON FOR VISIT:  1. Genetic testing   2. Malignant neoplasm of upper-outer quadrant of left breast in female, estrogen receptor positive (HCC)   3. Family history of prostate cancer    I connected with Jessica Moore on 10/28/2024 at 9:45 AM EDT by telephone and verified that I am speaking with the correct person using three identifiers.    Patient location: home Provider location: [facility]  HISTORY OF PRESENT ILLNESS:   Jessica Moore, a 59 y.o. female, was seen for a [place] cancer genetics consultation at the request of Dr. Melanee due to a personal and family history of cancer.  Jessica Moore presents to clinic today to discuss the possibility of a hereditary predisposition to cancer, genetic testing, and to further clarify her future cancer risks, as well as potential cancer risks for family members.   CANCER HISTORY:  Oncology History  Malignant neoplasm of upper-outer quadrant of left breast in female, estrogen receptor positive (HCC)  10/16/2024 Initial Diagnosis   Malignant neoplasm of upper-outer quadrant of left breast in female, estrogen receptor positive (HCC)   10/16/2024 Cancer Staging   Staging form: Breast, AJCC 8th Edition - Clinical stage from 10/16/2024: Stage IA (cT1b, cN0, cM0, G2, ER+, PR+, HER2-) - Signed by Melanee Annah BROCKS, MD on 10/16/2024 Stage prefix: Initial diagnosis Histologic grading system: 3 grade system   10/24/2024 Genetic Testing   Negative genetic testing. No pathogenic variants identified on the [facility] CancerNext+RNA Panel. The report date is 10/24/2024.  The [facility] CancerNext+RNAinsight Panel includes sequencing, rearrangement analysis, and RNA analysis for the following 40 genes: APC, ATM, BAP1, BARD1, BMPR1A, BRCA1, BRCA2, BRIP1, CDH1, CDKN2A, CHEK2, FH, FLCN, MET, MLH1, MSH2, MSH6, MUTYH, NF1, NTHL1, PALB2, PMS2,  PTEN, RAD51C, RAD51D, RPS20, SMAD4, STK11, TP53, TSC1, TSC2, and VHL (sequencing and deletion/duplication); AXIN2, HOXB13, MBD4, MSH3, POLD1 and POLE (sequencing only); EPCAM and GREM1 (deletion/duplication only).    In 2026, at the age of 55, Jessica Moore was diagnosed with left berast cancer, ER/PR+, HER2- The treatment plan includes lumpectomy planned for 10/29/2024 with Dr. Aron.   RELEVANT MEDICAL HISTORY:  Menarche was at age 70.  First live birth at age 77.  Ovaries intact: yes.  Hysterectomy: no.  Menopausal status: postmenopausal.  One previous breast bx in 2019.  Colonoscopy in 2019.   Past Medical History:  Diagnosis Date   Breast cancer (HCC)    Hypertension     Past Surgical History:  Procedure Laterality Date   BREAST BIOPSY     BREAST BIOPSY Left 09/15/2024   US  LT BREAST BX W LOC DEV 1ST LESION IMG BX SPEC US  GUIDE 09/15/2024 [facility]   BREAST BIOPSY Left 10/27/2024   US  LT RADIOACTIVE SEED LOC 10/27/2024 [facility]   COLONOSCOPY WITH PROPOFOL  N/A 03/25/2018   Procedure: COLONOSCOPY WITH PROPOFOL ;  Surgeon: Therisa Bi, MD;  [facility];  Service: Gastroenterology;  Laterality: N/A;   HAND TENDON SURGERY Right 1985   Pinky Finger   THYROIDECTOMY, PARTIAL Left 04/08/2019   Dr. Jerilynn   UTERINE FIBROID EMBOLIZATION      FAMILY HISTORY:  We obtained a detailed, 4-generation family history.  Significant diagnoses are listed below: Family History  Problem Relation Age of Onset   Diabetes Mother    Obesity Mother    Prostate cancer Brother    Hypertension Maternal Grandmother  Alcohol abuse Maternal Grandmother    Obesity Son    Stroke Son    Heart failure Son    Breast cancer Neg Hx    Brother - prostate cancer dx >60    Jessica Moore is unaware of previous family history of genetic testing for hereditary cancer risks. There is no reported Ashkenazi Jewish ancestry. There is no known consanguinity.  GENETIC COUNSELING  ASSESSMENT: Jessica Moore is a 59 y.o. female with a personal and family history of cancer which is somewhat suggestive of a hereditary cancer syndrome and predisposition to cancer. We, therefore, discussed and recommended the following at today's visit.   DISCUSSION: We discussed that, in general, most cancer is not inherited in families, but instead is sporadic or familial. We discussed that approximately 10% of breast cancer is hereditary. Most cases of hereditary breast cancer are associated with BRCA1/BRCA2 genes, although there are other genes associated with hereditary cancer as well. Cancers and risks are gene specific. We discussed that testing is beneficial for several reasons including knowing about cancer risks, identifying potential screening and risk-reduction options that may be appropriate, and to understand if other family members could be at risk for cancer and allow them to undergo genetic testing.   We reviewed the characteristics, features and inheritance patterns of hereditary cancer syndromes. We also discussed genetic testing, including the appropriate family members to test, the process of testing, insurance coverage and turn-around-time for results. We discussed the implications of a negative, positive and/or variant of uncertain significant result. We recommended Jessica Moore pursue genetic testing for the [facility] Cancer[facility]+RNA gene panel, which she had blood drawn for on 10/16/2024.SABRA   GENETIC TEST RESULTS:  The [facility] CancerNext+RNA Panel found no pathogenic mutations.   The [facility] CancerNext+RNAinsight Panel includes sequencing, rearrangement analysis, and RNA analysis for the following 40 genes: APC, ATM, BAP1, BARD1, BMPR1A, BRCA1, BRCA2, BRIP1, CDH1, CDKN2A, CHEK2, FH, FLCN, MET, MLH1, MSH2, MSH6, MUTYH, NF1, NTHL1, PALB2, PMS2, PTEN, RAD51C, RAD51D, RPS20, SMAD4, STK11, TP53, TSC1, TSC2, and VHL (sequencing and deletion/duplication); AXIN2, HOXB13, MBD4, MSH3, POLD1 and POLE  (sequencing only); EPCAM and GREM1 (deletion/duplication only).   The test report has been scanned into EPIC and is located under the Molecular Pathology section of the Results Review tab.  A portion of the result report is included below for reference. Genetic testing reported out on 10/24/2024.     Even though a pathogenic variant was not identified, possible explanations for the cancer in the family may include: There may be no hereditary risk for cancer in the family. The cancers in Jessica Moore and/or her family may be sporadic/familial or due to other genetic and environmental factors. There may be a gene mutation in one of these genes that current testing methods cannot detect but that chance is small. There could be another gene that has not yet been discovered, or that we have not yet tested, that is responsible for the cancer diagnoses in the family.  It is also possible there is a hereditary cause for the cancer in the family that Jessica Moore did not inherit.  Therefore, it is important to remain in touch with cancer genetics in the future so that we can continue to offer Jessica Moore the most up to date genetic testing.   ADDITIONAL GENETIC TESTING:  We discussed with Jessica Moore that her genetic testing was fairly extensive.  If there are additional relevant genes identified to increase cancer risk that can be analyzed in the future, we  would be happy to discuss and coordinate this testing at that time.    CANCER SCREENING RECOMMENDATIONS:  Jessica Moore test result is considered negative (normal).  This means that we have not identified a hereditary cause for her personal and family history of cancer at this time.   An individual's cancer risk and medical management are not determined by genetic test results alone. Overall cancer risk assessment incorporates additional factors, including personal medical history, family history, and any available genetic information that may result in  a personalized plan for cancer prevention and surveillance. Therefore, it is recommended she continue to follow the cancer management and screening guidelines provided by her oncology and primary healthcare provider.  RECOMMENDATIONS FOR FAMILY MEMBERS:   Since she did not inherit a identifiable mutation in a cancer predisposition gene included on this panel, her children could not have inherited a known mutation from her in one of these genes. Individuals in this family might be at some increased risk of developing cancer, over the general population risk, due to the family history of cancer.  Individuals in the family should notify their providers of the family history of cancer. We recommend women in this family have a yearly mammogram beginning at age 51, or 35 years younger than the earliest onset of cancer, an annual clinical breast exam, and perform monthly breast self-exams.  Family members should have colonoscopies by at age 38, or earlier, as recommended by their providers.  FOLLOW-UP:  Lastly, we discussed with Jessica Moore that cancer genetics is a rapidly advancing field and it is possible that new genetic tests will be appropriate for her and/or her family members in the future. We encouraged her to remain in contact with cancer genetics on an annual basis so we can update her personal and family histories and let her know of advances in cancer genetics that may benefit this family.   Our contact number was provided. Jessica Moore questions were answered to her satisfaction, and she knows she is welcome to call us  at anytime with additional questions or concerns.    Dena Cary, MS, [facility] Genetic Counselor [email or web address] Phone: [phone]  I personally spent a total of 35 minutes in the care of the patient today including getting/reviewing separately obtained history, counseling and educating, documenting clinical information in the EHR, and communicating  results. Dr. Delinda was available for discussion regarding this case.   _______________________________________________________________________ For Office Staff:  Number of people involved in session: 1 Was an Intern/ student involved with case: no'

## 2024-10-29 ENCOUNTER — Ambulatory Visit
Admission: RE | Admit: 2024-10-29 | Discharge: 2024-10-29 | Disposition: A | Discharge status: Home / Self Care | Payer: Self-pay | Source: Ambulatory Visit | Attending: General Surgery | Admitting: General Surgery

## 2024-10-29 ENCOUNTER — Encounter (HOSPITAL_COMMUNITY): Admission: RE | Disposition: A | Payer: Self-pay | Source: Home / Self Care | Attending: General Surgery

## 2024-10-29 ENCOUNTER — Encounter (HOSPITAL_COMMUNITY): Payer: Self-pay | Admitting: General Surgery

## 2024-10-29 ENCOUNTER — Ambulatory Visit (HOSPITAL_COMMUNITY): Payer: Self-pay | Admitting: Vascular Surgery

## 2024-10-29 ENCOUNTER — Other Ambulatory Visit: Payer: Self-pay

## 2024-10-29 ENCOUNTER — Ambulatory Visit (HOSPITAL_COMMUNITY)
Admission: RE | Admit: 2024-10-29 | Discharge: 2024-10-29 | Disposition: A | Discharge status: Home / Self Care | Payer: Self-pay | Attending: General Surgery | Admitting: General Surgery

## 2024-10-29 ENCOUNTER — Ambulatory Visit (HOSPITAL_BASED_OUTPATIENT_CLINIC_OR_DEPARTMENT_OTHER): Payer: Self-pay | Admitting: Anesthesiology

## 2024-10-29 DIAGNOSIS — C50212 Malignant neoplasm of upper-inner quadrant of left female breast: Secondary | ICD-10-CM | POA: Insufficient documentation

## 2024-10-29 DIAGNOSIS — I10 Essential (primary) hypertension: Secondary | ICD-10-CM | POA: Insufficient documentation

## 2024-10-29 DIAGNOSIS — Z1732 Human epidermal growth factor receptor 2 negative status: Secondary | ICD-10-CM | POA: Insufficient documentation

## 2024-10-29 DIAGNOSIS — Z1721 Progesterone receptor positive status: Secondary | ICD-10-CM | POA: Insufficient documentation

## 2024-10-29 DIAGNOSIS — C50912 Malignant neoplasm of unspecified site of left female breast: Secondary | ICD-10-CM

## 2024-10-29 DIAGNOSIS — Z170 Estrogen receptor positive status [ER+]: Secondary | ICD-10-CM | POA: Insufficient documentation

## 2024-10-29 MED ORDER — LIDOCAINE 2% (20 MG/ML) 5 ML SYRINGE
INTRAMUSCULAR | Status: DC | PRN
  Administered 2024-10-29: 80 mg via INTRAVENOUS

## 2024-10-29 MED ORDER — ONDANSETRON HCL 4 MG/2ML IJ SOLN: 4.0000 mg | Freq: Once | INTRAMUSCULAR | Status: DC | PRN

## 2024-10-29 MED ORDER — 0.9 % SODIUM CHLORIDE (POUR BTL) OPTIME
TOPICAL | Status: DC | PRN
  Administered 2024-10-29: 1000 mL

## 2024-10-29 MED ORDER — ACETAMINOPHEN 500 MG PO TABS
1000.0000 mg | ORAL_TABLET | ORAL | Status: AC
  Administered 2024-10-29: 1000 mg via ORAL
  Filled 2024-10-29: qty 2

## 2024-10-29 MED ORDER — AMISULPRIDE (ANTIEMETIC) 5 MG/2ML IV SOLN: 10.0000 mg | Freq: Once | INTRAVENOUS | Status: DC | PRN

## 2024-10-29 MED ORDER — BUPIVACAINE-EPINEPHRINE (PF) 0.5% -1:200000 IJ SOLN
INTRAMUSCULAR | Status: DC | PRN
  Administered 2024-10-29: 30 mL via PERINEURAL

## 2024-10-29 MED ORDER — PROPOFOL 10 MG/ML IV BOLUS
INTRAVENOUS | Status: DC | PRN
  Administered 2024-10-29: 170 mg via INTRAVENOUS

## 2024-10-29 MED ORDER — FENTANYL CITRATE (PF) 250 MCG/5ML IJ SOLN
INTRAMUSCULAR | Status: DC | PRN
  Administered 2024-10-29: 50 ug via INTRAVENOUS
  Administered 2024-10-29 (×2): 25 ug via INTRAVENOUS

## 2024-10-29 MED ORDER — MIDAZOLAM HCL 2 MG/2ML IJ SOLN
INTRAMUSCULAR | Status: AC
  Administered 2024-10-29: 2 mg via INTRAVENOUS
  Filled 2024-10-29: qty 2

## 2024-10-29 MED ORDER — FENTANYL CITRATE (PF) 100 MCG/2ML IJ SOLN
INTRAMUSCULAR | Status: AC
  Administered 2024-10-29: 50 ug via INTRAVENOUS
  Filled 2024-10-29: qty 2

## 2024-10-29 MED ORDER — EPHEDRINE SULFATE-NACL 50-0.9 MG/10ML-% IV SOSY
PREFILLED_SYRINGE | INTRAVENOUS | Status: DC | PRN
  Administered 2024-10-29 (×3): 2.5 mg via INTRAVENOUS
  Administered 2024-10-29 (×2): 5 mg via INTRAVENOUS

## 2024-10-29 MED ORDER — MIDAZOLAM HCL (PF) 2 MG/2ML IJ SOLN: 2.0000 mg | Freq: Once | INTRAMUSCULAR | Status: AC

## 2024-10-29 MED ORDER — ONDANSETRON HCL 4 MG/2ML IJ SOLN
INTRAMUSCULAR | Status: AC
  Filled 2024-10-29: qty 2

## 2024-10-29 MED ORDER — PHENYLEPHRINE HCL-NACL 20-0.9 MG/250ML-% IV SOLN
INTRAVENOUS | Status: DC | PRN
  Administered 2024-10-29: 40 ug/min via INTRAVENOUS

## 2024-10-29 MED ORDER — CHLORHEXIDINE GLUCONATE 0.12 % MT SOLN
15.0000 mL | Freq: Once | OROMUCOSAL | Status: AC
  Administered 2024-10-29: 15 mL via OROMUCOSAL
  Filled 2024-10-29: qty 15

## 2024-10-29 MED ORDER — PHENYLEPHRINE HCL-NACL 20-0.9 MG/250ML-% IV SOLN
INTRAVENOUS | Status: AC
  Filled 2024-10-29: qty 250

## 2024-10-29 MED ORDER — FENTANYL CITRATE (PF) 100 MCG/2ML IJ SOLN: 50.0000 ug | Freq: Once | INTRAMUSCULAR | Status: AC

## 2024-10-29 MED ORDER — MIDAZOLAM HCL 2 MG/2ML IJ SOLN
INTRAMUSCULAR | Status: AC
  Filled 2024-10-29: qty 2

## 2024-10-29 MED ORDER — OXYCODONE HCL 5 MG PO TABS: 5.0000 mg | ORAL_TABLET | Freq: Four times a day (QID) | ORAL | 0 refills | Status: AC | PRN

## 2024-10-29 MED ORDER — DEXAMETHASONE SOD PHOSPHATE PF 10 MG/ML IJ SOLN
INTRAMUSCULAR | Status: AC
  Filled 2024-10-29: qty 1

## 2024-10-29 MED ORDER — MIDAZOLAM HCL (PF) 2 MG/2ML IJ SOLN
INTRAMUSCULAR | Status: DC | PRN
  Administered 2024-10-29: 1 mg via INTRAVENOUS

## 2024-10-29 MED ORDER — LIDOCAINE HCL 1 % IJ SOLN
INTRAMUSCULAR | Status: DC | PRN
  Administered 2024-10-29: 13 mL

## 2024-10-29 MED ORDER — MAGTRACE LYMPHATIC TRACER
INTRAMUSCULAR | Status: DC | PRN
  Administered 2024-10-29: 2 mL via INTRAMUSCULAR

## 2024-10-29 MED ORDER — OXYCODONE HCL 5 MG PO TABS
ORAL_TABLET | ORAL | Status: AC
  Filled 2024-10-29: qty 1

## 2024-10-29 MED ORDER — CHLORHEXIDINE GLUCONATE CLOTH 2 % EX PADS: 6.0000 | MEDICATED_PAD | Freq: Once | CUTANEOUS | Status: DC

## 2024-10-29 MED ORDER — FENTANYL CITRATE (PF) 100 MCG/2ML IJ SOLN: 25.0000 ug | INTRAMUSCULAR | Status: DC | PRN

## 2024-10-29 MED ORDER — PROPOFOL 10 MG/ML IV BOLUS
INTRAVENOUS | Status: AC
  Filled 2024-10-29: qty 20

## 2024-10-29 MED ORDER — CEFAZOLIN SODIUM-DEXTROSE 2-4 GM/100ML-% IV SOLN
2.0000 g | INTRAVENOUS | Status: AC
  Administered 2024-10-29: 2 g via INTRAVENOUS
  Filled 2024-10-29: qty 100

## 2024-10-29 MED ORDER — ONDANSETRON HCL 4 MG/2ML IJ SOLN
INTRAMUSCULAR | Status: DC | PRN
  Administered 2024-10-29: 4 mg via INTRAVENOUS

## 2024-10-29 MED ORDER — LIDOCAINE HCL (PF) 1 % IJ SOLN
INTRAMUSCULAR | Status: AC
  Filled 2024-10-29: qty 30

## 2024-10-29 MED ORDER — FENTANYL CITRATE (PF) 100 MCG/2ML IJ SOLN
INTRAMUSCULAR | Status: AC
  Filled 2024-10-29: qty 2

## 2024-10-29 MED ORDER — LACTATED RINGERS IV SOLN: INTRAVENOUS | Status: DC

## 2024-10-29 MED ORDER — DEXAMETHASONE SOD PHOSPHATE PF 10 MG/ML IJ SOLN
INTRAMUSCULAR | Status: DC | PRN
  Administered 2024-10-29: 10 mg via INTRAVENOUS

## 2024-10-29 MED ORDER — PHENYLEPHRINE 80 MCG/ML (10ML) SYRINGE FOR IV PUSH (FOR BLOOD PRESSURE SUPPORT)
PREFILLED_SYRINGE | INTRAVENOUS | Status: DC | PRN
  Administered 2024-10-29 (×2): 160 ug via INTRAVENOUS
  Administered 2024-10-29: 80 ug via INTRAVENOUS

## 2024-10-29 MED ORDER — ORAL CARE MOUTH RINSE: 15.0000 mL | Freq: Once | OROMUCOSAL | Status: AC

## 2024-10-29 MED ORDER — CLONIDINE HCL (ANALGESIA) 100 MCG/ML EP SOLN
EPIDURAL | Status: DC | PRN
  Administered 2024-10-29: 50 ug

## 2024-10-29 MED ORDER — OXYCODONE HCL 5 MG PO TABS
5.0000 mg | ORAL_TABLET | Freq: Once | ORAL | Status: AC
  Administered 2024-10-29: 5 mg via ORAL

## 2024-10-29 MED ORDER — LIDOCAINE 2% (20 MG/ML) 5 ML SYRINGE
INTRAMUSCULAR | Status: AC
  Filled 2024-10-29: qty 5

## 2024-10-29 MED ORDER — BUPIVACAINE-EPINEPHRINE (PF) 0.25% -1:200000 IJ SOLN
INTRAMUSCULAR | Status: AC
  Filled 2024-10-29: qty 30

## 2024-10-29 MED ORDER — ALBUMIN HUMAN 5 % IV SOLN: INTRAVENOUS | Status: DC | PRN

## 2024-10-29 MED ORDER — KETOROLAC TROMETHAMINE 30 MG/ML IJ SOLN
INTRAMUSCULAR | Status: DC | PRN
  Administered 2024-10-29: 30 mg via INTRAVENOUS

## 2024-10-29 NOTE — Discharge Instructions (Addendum)
 [facility] Office Phone Number [phone]  BREAST BIOPSY/ PARTIAL MASTECTOMY: POST OP INSTRUCTIONS  Always review your discharge instruction sheet given to you by the facility where your surgery was performed.  IF YOU HAVE DISABILITY OR FAMILY LEAVE FORMS, YOU MUST BRING THEM TO THE OFFICE FOR PROCESSING.  DO NOT GIVE THEM TO YOUR DOCTOR.  Take 2 tylenol  (acetominophen) three times a day for 3 days.  If you still have pain, add ibuprofen with food in between if able to take this (if you have kidney issues or stomach issues, do not take ibuprofen).  If both of those are not enough, add the narcotic pain pill.  If you find you are needing a lot of this overnight after surgery, call the next morning for a refill.    Prescriptions will not be filled after 5pm or on week-ends. Take your usually prescribed medications unless otherwise directed You should eat very light the first 24 hours after surgery, such as soup, crackers, pudding, etc.  Resume your normal diet the day after surgery. Most patients will experience some swelling and bruising in the breast.  Ice packs and a good support bra will help.  Swelling and bruising can take several days to resolve.  It is common to experience some constipation if taking pain medication after surgery.  Increasing fluid intake and taking a stool softener will usually help or prevent this problem from occurring.  A mild laxative (Milk of Magnesia or Miralax ) should be taken according to package directions if there are no bowel movements after 48 hours. Unless discharge instructions indicate otherwise, you may remove your bandages 48 hours after surgery, and you may shower at that time.  You may have steri-strips (small skin tapes) in place directly over the incision.  These strips should be left on the skin at least for for 7-10 days.    ACTIVITIES:  You may resume regular daily activities (gradually increasing) beginning the next day.  Wearing a  good support bra or sports bra (or the breast binder) minimizes pain and swelling.  You may have sexual intercourse when it is comfortable. No heavy lifting for 1-2 weeks (not over around 10 pounds).  You may drive when you no longer are taking prescription pain medication, you can comfortably wear a seatbelt, and you can safely maneuver your car and apply brakes. RETURN TO WORK:  __________3-14 days depending on job. _______________ Jessica Moore should see your doctor in the office for a follow-up appointment approximately two weeks after your surgery.  Your doctor's nurse will typically make your follow-up appointment when she calls you with your pathology report.  Expect your pathology report 3-4 business days after your surgery.  You may call to check if you do not hear from us  after three days.   WHEN TO CALL YOUR DOCTOR: Fever over 101.0 Nausea and/or vomiting. Extreme swelling or bruising. Continued bleeding from incision. Increased pain, redness, or drainage from the incision.  The clinic staff is available to answer your questions during regular business hours.  Please don't hesitate to call and ask to speak to one of the nurses for clinical concerns.  If you have a medical emergency, go to the nearest emergency room or call 911.  A surgeon from [facility] is always on call at the hospital.  For further questions, please visit [email or web address]

## 2024-10-29 NOTE — Transfer of Care (Signed)
 Immediate Anesthesia Transfer of Care Note  Patient: Jessica Moore  Procedure(s) Performed: LEFT BREAST LUMPECTOMY WITH RADIOACTIVE SEED (Left: Breast) BIOPSY, LYMPH NODE, LEFT SENTINEL (Left: Axilla)  Patient Location: PACU  Anesthesia Type:General  Level of Consciousness: drowsy and patient cooperative  Airway & Oxygen Therapy: Patient Spontanous Breathing and Patient connected to face mask oxygen  Post-op Assessment: Report given to RN and Post -op Vital signs reviewed and stable  Post vital signs: Reviewed and stable  Last Vitals:  Vitals Value Taken Time  BP 126/90 10/29/24 17:23  Temp    Pulse 81 10/29/24 17:26  Resp 14 10/29/24 17:26  SpO2 100 % 10/29/24 17:26  Vitals shown include unfiled device data.  Last Pain:  Vitals:   10/29/24 1342  TempSrc:   PainSc: 3       Patients Stated Pain Goal: 1 (10/29/24 1301)  Complications: No notable events documented.

## 2024-10-29 NOTE — Op Note (Signed)
 Left Breast Radioactive seed localized lumpectomy and sentinel lymph node biopsy  Indications: This patient presents with history of left breast cancer, cT1bN0 grade 2 invasive ductal carcinoma, upper inner quadrant, ER+/PR+/Her2-, Ki 67 10%  Pre-operative Diagnosis: left breast cancer  Post-operative Diagnosis: Same  Surgeon: ARON SHOULDERS   Assistant: Lauraine Nick, PA-S  Anesthesia: General endotracheal anesthesia  ASA Class: 2  Procedure Details  The patient was seen in the Holding Room. The risks, benefits, complications, treatment options, and expected outcomes were discussed with the patient. The possibilities of bleeding, infection, the need for additional procedures, failure to diagnose a condition, and creating a complication requiring transfusion or operation were discussed with the patient. The patient concurred with the proposed plan, giving informed consent.  The site of surgery properly noted/marked. The patient was taken to Operating Room # 2, identified, and the procedure verified as left Breast Seed localized Lumpectomy with sentinel lymph node biopsy. The left arm, breast, and chest were prepped and draped in standard fashion. A Time Out was held and the above information confirmed. The MagTrace was injected into the subareolar position.  The lumpectomy was performed by creating a transverse medial incision near the previously placed radioactive seed.  Dissection was carried down to around the point of maximum signal intensity. The cautery was used to perform the dissection.  Hemostasis was achieved with cautery. The edges of the cavity were marked with large clips.   The specimen was inked with the margin marker paint kit.    Specimen radiography confirmed inclusion of the mammographic lesion, the clip, and the seed.  The background signal in the breast was zero.  The clip and seed appeared to be centrally located, so no additional margins were taken.  The wound was  irrigated and reinspected for hemostasis.  The breast tissue was rearranged and 2-0 vicryl interrupted mastopexy sutures were placed to minimize the defect.  The skin was then closed with 3-0 vicryl in layers and 4-0 monocryl subcuticular suture.    An oblique incision was created below the axillary hairline.  Dissection was carried through the clavipectoral fascia.  Two deep level two axillary sentinel nodes were removed.  Counts per second were 1700 and 300.    The background count was 50 cps.  The wound was irrigated.  Hemostasis was achieved with cautery.  The axillary incision was closed with a 3-0 vicryl deep dermal interrupted sutures and a 4-0 monocryl subcuticular closure.    Sterile dressings were applied. At the end of the operation, all sponge, instrument, and needle counts were correct.  Findings: grossly clear surgical margins and no palpable adenopathy, anterior margin is skin.    Estimated Blood Loss:  min         Specimens: left breast tissue with seed, sentinel lymph node 1 and sentinel node #2.           Complications:  None; patient tolerated the procedure well.         Disposition: PACU - hemodynamically stable.         Condition: stable

## 2024-10-29 NOTE — Anesthesia Procedure Notes (Addendum)
 Anesthesia Regional Block: Pectoralis block   Pre-Anesthetic Checklist: , timeout performed,  Correct Patient, Correct Site, Correct Laterality,  Correct Procedure, Correct Position, site marked,  Risks and benefits discussed,  Surgical consent,  Pre-op evaluation,  At surgeon's request and post-op pain management  Laterality: Left  Prep: chloraprep       Needles:  Injection technique: Single-shot  Needle Type: Echogenic Needle     Needle Length: 9cm  Needle Gauge: 21     Additional Needles:   Procedures:,,,, ultrasound used (permanent image in chart),,    Narrative:  Start time: 10/29/2024 1:40 PM End time: 10/29/2024 1:47 PM Injection made incrementally with aspirations every 5 mL.  Performed by: Personally  Anesthesiologist: Corinne Garnette BRAVO, MD  Additional Notes: No pain on injection. No increased resistance to injection. Injection made in 5cc increments.  Good needle visualization.  Patient tolerated procedure well.

## 2024-10-29 NOTE — Anesthesia Procedure Notes (Signed)
 Procedure Name: LMA Insertion Date/Time: 10/29/2024 3:43 PM  Performed by: Erlene, Jermall Isaacson  K, CRNAPre-anesthesia Checklist: Patient identified, Emergency Drugs available, Suction available and Patient being monitored Patient Re-evaluated:Patient Re-evaluated prior to induction Oxygen Delivery Method: Circle System Utilized Preoxygenation: Pre-oxygenation with 100% oxygen Induction Type: IV induction Ventilation: Mask ventilation without difficulty LMA: LMA inserted LMA Size: 4.0 Number of attempts: 1 Airway Equipment and Method: Bite block Placement Confirmation: positive ETCO2 Tube secured with: Tape Dental Injury: Teeth and Oropharynx as per pre-operative assessment

## 2024-10-29 NOTE — Anesthesia Postprocedure Evaluation (Signed)
"   Anesthesia Post Note  Patient: Jessica Moore  Procedure(s) Performed: LEFT BREAST LUMPECTOMY WITH RADIOACTIVE SEED (Left: Breast) BIOPSY, LYMPH NODE, LEFT SENTINEL (Left: Axilla)     Patient location during evaluation: PACU Anesthesia Type: General Level of consciousness: awake and alert Pain management: pain level controlled Vital Signs Assessment: post-procedure vital signs reviewed and stable Respiratory status: spontaneous breathing, nonlabored ventilation, respiratory function stable and patient connected to nasal cannula oxygen Cardiovascular status: blood pressure returned to baseline and stable Postop Assessment: no apparent nausea or vomiting Anesthetic complications: no   No notable events documented.  Last Vitals:  Vitals:   10/29/24 1356 10/29/24 1730  BP: 101/74 121/81  Pulse: 81 82  Resp: 13 16  Temp:  36.6 C  SpO2: 99% 97%    Last Pain:  Vitals:   10/29/24 1730  TempSrc:   PainSc: Asleep                 Garnette FORBES Skillern      "

## 2024-10-29 NOTE — Interval H&P Note (Signed)
 History and Physical Interval Note:  10/29/2024 3:01 PM  Jessica Moore  has presented today for surgery, with the diagnosis of LEFT BREAST CANCER.  The various methods of treatment have been discussed with the patient and family. After consideration of risks, benefits and other options for treatment, the patient has consented to  Procedures with comments: LEFT BREAST RADIOACTIVE SEED LOCALIZED LUMPECTOMY AND SENTINEL LYMPH NODE BIOPSY as a surgical intervention.  The patient's history has been reviewed, patient examined, no change in status, stable for surgery.  I have reviewed the patient's chart and labs.  Questions were answered to the patient's satisfaction.     Jina Nephew

## 2024-10-30 ENCOUNTER — Encounter (HOSPITAL_COMMUNITY): Payer: Self-pay | Admitting: General Surgery

## 2024-10-30 NOTE — Addendum Note (Signed)
 Addendum  created 10/30/24 1800 by Corinne Garnette BRAVO, MD   Clinical Note Signed, Intraprocedure Blocks edited, SmartForm saved

## 2024-11-02 ENCOUNTER — Ambulatory Visit: Payer: Self-pay | Admitting: General Surgery

## 2024-11-02 LAB — SURGICAL PATHOLOGY

## 2024-11-03 ENCOUNTER — Encounter: Payer: Self-pay | Admitting: *Deleted

## 2024-11-11 ENCOUNTER — Other Ambulatory Visit: Payer: Self-pay

## 2024-11-18 ENCOUNTER — Inpatient Hospital Stay: Payer: Self-pay | Admitting: Occupational Therapy

## 2024-11-18 ENCOUNTER — Inpatient Hospital Stay

## 2024-11-18 ENCOUNTER — Inpatient Hospital Stay: Payer: Self-pay | Admitting: Oncology

## 2024-11-18 ENCOUNTER — Ambulatory Visit: Payer: Self-pay | Admitting: Radiation Oncology

## 2024-11-19 ENCOUNTER — Ambulatory Visit: Payer: Self-pay | Admitting: Radiation Oncology
